# Patient Record
Sex: Male | Born: 1940 | Race: White | Hispanic: No | Marital: Married | Smoking: Never smoker
Health system: Southern US, Community
[De-identification: ages and names within clinical notes are randomized; demographics above are authoritative.]

## PROBLEM LIST (undated history)

## (undated) DIAGNOSIS — C3490 Malignant neoplasm of unspecified part of unspecified bronchus or lung: Secondary | ICD-10-CM

## (undated) DIAGNOSIS — E785 Hyperlipidemia, unspecified: Secondary | ICD-10-CM

## (undated) DIAGNOSIS — I219 Acute myocardial infarction, unspecified: Secondary | ICD-10-CM

## (undated) DIAGNOSIS — Z8719 Personal history of other diseases of the digestive system: Secondary | ICD-10-CM

## (undated) DIAGNOSIS — C61 Malignant neoplasm of prostate: Secondary | ICD-10-CM

## (undated) DIAGNOSIS — C44509 Unspecified malignant neoplasm of skin of other part of trunk: Secondary | ICD-10-CM

## (undated) HISTORY — PX: CARDIAC CATHETERIZATION: SHX172

## (undated) HISTORY — PX: PROSTATECTOMY: SHX69

## (undated) HISTORY — PX: EYE SURGERY: SHX253

---

## 2008-01-07 ENCOUNTER — Ambulatory Visit: Payer: Self-pay | Admitting: Cardiology

## 2013-06-19 ENCOUNTER — Ambulatory Visit: Payer: Self-pay | Admitting: Gastroenterology

## 2013-12-16 DIAGNOSIS — C4360 Malignant melanoma of unspecified upper limb, including shoulder: Secondary | ICD-10-CM | POA: Insufficient documentation

## 2014-04-27 DIAGNOSIS — I2510 Atherosclerotic heart disease of native coronary artery without angina pectoris: Secondary | ICD-10-CM | POA: Insufficient documentation

## 2014-06-25 DIAGNOSIS — Z9889 Other specified postprocedural states: Secondary | ICD-10-CM | POA: Insufficient documentation

## 2016-06-28 DIAGNOSIS — E782 Mixed hyperlipidemia: Secondary | ICD-10-CM | POA: Insufficient documentation

## 2017-03-21 ENCOUNTER — Other Ambulatory Visit: Payer: Self-pay | Admitting: Internal Medicine

## 2017-03-21 DIAGNOSIS — R911 Solitary pulmonary nodule: Secondary | ICD-10-CM

## 2017-05-06 ENCOUNTER — Ambulatory Visit
Admission: RE | Admit: 2017-05-06 | Discharge: 2017-05-06 | Disposition: A | Discharge status: Home / Self Care | Payer: Medicare Other | Source: Ambulatory Visit | Attending: Internal Medicine | Admitting: Internal Medicine

## 2017-05-06 DIAGNOSIS — I2510 Atherosclerotic heart disease of native coronary artery without angina pectoris: Secondary | ICD-10-CM | POA: Insufficient documentation

## 2017-05-06 DIAGNOSIS — J8410 Pulmonary fibrosis, unspecified: Secondary | ICD-10-CM | POA: Insufficient documentation

## 2017-05-06 DIAGNOSIS — R911 Solitary pulmonary nodule: Secondary | ICD-10-CM

## 2017-05-06 DIAGNOSIS — M899 Disorder of bone, unspecified: Secondary | ICD-10-CM | POA: Diagnosis not present

## 2017-05-06 DIAGNOSIS — I700 Atherosclerosis of aorta: Secondary | ICD-10-CM | POA: Insufficient documentation

## 2017-05-10 ENCOUNTER — Other Ambulatory Visit: Payer: Self-pay | Admitting: Internal Medicine

## 2017-05-10 DIAGNOSIS — R9389 Abnormal findings on diagnostic imaging of other specified body structures: Secondary | ICD-10-CM

## 2017-05-10 DIAGNOSIS — R918 Other nonspecific abnormal finding of lung field: Secondary | ICD-10-CM

## 2017-05-10 DIAGNOSIS — C61 Malignant neoplasm of prostate: Secondary | ICD-10-CM

## 2017-05-15 ENCOUNTER — Encounter
Admission: RE | Admit: 2017-05-15 | Discharge: 2017-05-15 | Disposition: A | Discharge status: Home / Self Care | Payer: Medicare Other | Source: Ambulatory Visit | Attending: Internal Medicine | Admitting: Internal Medicine

## 2017-05-15 DIAGNOSIS — R918 Other nonspecific abnormal finding of lung field: Secondary | ICD-10-CM | POA: Insufficient documentation

## 2017-05-15 DIAGNOSIS — R938 Abnormal findings on diagnostic imaging of other specified body structures: Secondary | ICD-10-CM | POA: Insufficient documentation

## 2017-05-15 DIAGNOSIS — R9389 Abnormal findings on diagnostic imaging of other specified body structures: Secondary | ICD-10-CM

## 2017-05-15 DIAGNOSIS — C61 Malignant neoplasm of prostate: Secondary | ICD-10-CM | POA: Diagnosis present

## 2017-05-15 LAB — GLUCOSE, CAPILLARY: GLUCOSE-CAPILLARY: 93 mg/dL (ref 65–99)

## 2017-05-15 MED ORDER — FLUDEOXYGLUCOSE F - 18 (FDG) INJECTION
12.8900 | Freq: Once | INTRAVENOUS | Status: AC | PRN
  Administered 2017-05-15: 12.89 via INTRAVENOUS

## 2017-05-20 ENCOUNTER — Encounter: Payer: Self-pay | Admitting: *Deleted

## 2017-05-20 ENCOUNTER — Other Ambulatory Visit: Payer: Self-pay | Admitting: *Deleted

## 2017-05-20 DIAGNOSIS — R918 Other nonspecific abnormal finding of lung field: Secondary | ICD-10-CM

## 2017-05-20 NOTE — Progress Notes (Signed)
  Oncology Nurse Navigator Documentation  Navigator Location: [facility] (05/20/17 1000) Referral date to RadOnc/MedOnc: 05/17/17 (05/20/17 1000) )Navigator Encounter Type: Introductory phone call (05/20/17 1000)   Abnormal Finding Date: 05/06/17 (05/20/17 1000)                     Barriers/Navigation Needs: Coordination of Care (05/20/17 1000)   Interventions: Coordination of Care (05/20/17 1000)   Coordination of Care: Appts (05/20/17 1000)        Acuity: Level 2 (05/20/17 1000)   Acuity Level 2: Initial guidance, education and coordination as needed;Assistance expediting appointments (05/20/17 1000)    Referral received from Dr. Emily Filbert for lung mass, bone mets. Discussed with Dr. Rogue Bussing. Pt scheduled for Lung [facility]DC on Friday 7/20 at 9:30am. Appt scheduled for Pulmonary on Friday 7/20 at 11am. Pt to see Dr. Baruch Gouty on Tuesday 7/17 at 1pm, then CT sim following at 2pm. All appts explained to patient and verbally given to patient over the phone. Pt verbalized understanding and confirmed appts. States will be out of town next week. Pt added for case conference on Thursday 7/19 to review images and discuss route of biopsy.    Time Spent with Patient: 60 (05/20/17 1000)

## 2017-05-21 ENCOUNTER — Encounter: Payer: Self-pay | Admitting: Radiation Oncology

## 2017-05-21 ENCOUNTER — Ambulatory Visit
Admission: RE | Admit: 2017-05-21 | Discharge: 2017-05-21 | Disposition: A | Discharge status: Home / Self Care | Payer: Medicare Other | Source: Ambulatory Visit | Attending: Radiation Oncology | Admitting: Radiation Oncology

## 2017-05-21 ENCOUNTER — Encounter: Payer: Self-pay | Admitting: *Deleted

## 2017-05-21 ENCOUNTER — Ambulatory Visit: Admission: RE | Admit: 2017-05-21 | Payer: Medicare Other | Source: Ambulatory Visit

## 2017-05-21 VITALS — BP 128/70 | HR 52 | Temp 98.2°F | Resp 16 | Ht 69.5 in | Wt 190.7 lb

## 2017-05-21 DIAGNOSIS — C799 Secondary malignant neoplasm of unspecified site: Secondary | ICD-10-CM

## 2017-05-21 DIAGNOSIS — M79652 Pain in left thigh: Secondary | ICD-10-CM | POA: Diagnosis not present

## 2017-05-21 DIAGNOSIS — E785 Hyperlipidemia, unspecified: Secondary | ICD-10-CM | POA: Insufficient documentation

## 2017-05-21 DIAGNOSIS — Z9079 Acquired absence of other genital organ(s): Secondary | ICD-10-CM | POA: Insufficient documentation

## 2017-05-21 DIAGNOSIS — M899 Disorder of bone, unspecified: Secondary | ICD-10-CM | POA: Diagnosis not present

## 2017-05-21 DIAGNOSIS — Z8546 Personal history of malignant neoplasm of prostate: Secondary | ICD-10-CM | POA: Diagnosis not present

## 2017-05-21 DIAGNOSIS — Z79899 Other long term (current) drug therapy: Secondary | ICD-10-CM | POA: Diagnosis not present

## 2017-05-21 DIAGNOSIS — R05 Cough: Secondary | ICD-10-CM | POA: Insufficient documentation

## 2017-05-21 DIAGNOSIS — R918 Other nonspecific abnormal finding of lung field: Secondary | ICD-10-CM | POA: Diagnosis present

## 2017-05-21 DIAGNOSIS — Z7982 Long term (current) use of aspirin: Secondary | ICD-10-CM | POA: Insufficient documentation

## 2017-05-21 DIAGNOSIS — Z809 Family history of malignant neoplasm, unspecified: Secondary | ICD-10-CM | POA: Diagnosis not present

## 2017-05-21 DIAGNOSIS — R490 Dysphonia: Secondary | ICD-10-CM | POA: Insufficient documentation

## 2017-05-21 DIAGNOSIS — I252 Old myocardial infarction: Secondary | ICD-10-CM | POA: Diagnosis not present

## 2017-05-21 HISTORY — DX: Hyperlipidemia, unspecified: E78.5

## 2017-05-21 HISTORY — DX: Malignant neoplasm of unspecified part of unspecified bronchus or lung: C34.90

## 2017-05-21 HISTORY — DX: Malignant neoplasm of prostate: C61

## 2017-05-21 HISTORY — DX: Acute myocardial infarction, unspecified: I21.9

## 2017-05-21 NOTE — Progress Notes (Signed)
Patient here for initial evaluation. Having moderate pain in left lower leg.

## 2017-05-21 NOTE — Progress Notes (Signed)
  Oncology Nurse Navigator Documentation  Navigator Location: [facility] (05/21/17 1500)   )Navigator Encounter Type: Initial RadOnc (05/21/17 1500)                         Barriers/Navigation Needs: Coordination of Care (05/21/17 1500)   Interventions: Coordination of Care (05/21/17 1500)   Coordination of Care: Radiology (05/21/17 1500)         met with patient during initial rad-onc consultation for bone mets. All questions answered at time of visit. All future appts reviewed with patient. Pt instructed to call with any further questions. Pt verbalized understanding.          Time Spent with Patient: 60 (05/21/17 1500)

## 2017-05-21 NOTE — Consult Note (Signed)
NEW PATIENT EVALUATION  Name: George Greene.  MRN: [id number]  Date:   05/21/2017     DOB: Sep 23, 1941   This 76 y.o. 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  REFERRING PHYSICIAN: Rusty Aus, MD  CHIEF COMPLAINT:  Chief Complaint  Patient presents with  . lung mets    DIAGNOSIS: The encounter diagnosis was Metastatic disease ([place]).   PREVIOUS INVESTIGATIONS:  PET CT and CT scan reviewed Clinical notes reviewed  HPI: Patient is a 76 year old male with history of prostate cancer status post prostatectomy proximal me 10 years prior with salvage radiation therapy delivered at [facility]. His PSA has been around 6. He recently presented with some cough chest x-ray followed by CT scan showed a 3 cm cavitary lesions appear segment of left lower lobe concerning for potential primary lung cancer. Also multiple sclerotic lesions throughout the axial and appendicular skeleton concerning for metastatic disease. PET scan followed again confirming hypermetabolic mass in his left lung as well as hilar mediastinal adenopathy. Also widespread bony metastasis consistent with metastatic disease. Patient has not had a tissue diagnosis established as of yet. He does complain of some pain in his mid left thigh does have 2 separate lesions noted on PET CT scan in the left femur. I was asked to evaluate the patient for possible palliative radiation therapy. 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  PLANNED TREATMENT REGIMEN: Palliative radiation therapy to his left femur  PAST MEDICAL HISTORY:  has a past medical history of Hyperlipidemia; Lung cancer ([place]); Myocardial infarction [facility]); and Prostate cancer ([place]).    PAST SURGICAL HISTORY: History reviewed. No pertinent surgical history.  FAMILY HISTORY: family history includes Cancer in his maternal uncle.  SOCIAL  HISTORY:  reports that he has never smoked. He has never used smokeless tobacco. He reports that he drinks alcohol. He reports that he does not use drugs.  ALLERGIES: Patient has no allergy information on record.  MEDICATIONS:  Current Outpatient Prescriptions  Medication Sig Dispense Refill  . aspirin EC 81 MG tablet Take by mouth.    Marland Kitchen lisinopril (PRINIVIL,ZESTRIL) 5 MG tablet TAKE ONE (1) TABLET EACH DAY    . metoprolol tartrate (LOPRESSOR) 25 MG tablet TAKE ONE TABLET TWICE DAILY    . Multiple Vitamins-Minerals (EQL VISION FORMULA) TABS Take by mouth.    . rosuvastatin (CRESTOR) 10 MG tablet TAKE ONE (1) TABLET EACH DAY    . traMADol (ULTRAM) 50 MG tablet      No current facility-administered medications for this encounter.     ECOG PERFORMANCE STATUS:  1 - Symptomatic but completely ambulatory  REVIEW OF SYSTEMS:  Patient denies any weight loss, fatigue, weakness, fever, chills or night sweats. Patient denies any loss of vision, blurred vision. Patient denies any ringing  of the ears or hearing loss. No irregular heartbeat. Patient denies heart murmur or history of fainting. Patient denies any chest pain or pain radiating to her upper extremities. Patient denies any shortness of breath, difficulty breathing at night, cough or hemoptysis. Patient denies any swelling in the lower legs. Patient denies any nausea vomiting, vomiting of blood, or coffee ground material in the vomitus. Patient denies any stomach pain. Patient states has had normal bowel movements no significant constipation or diarrhea. Patient denies any dysuria, hematuria or significant nocturia. Patient denies any problems walking, swelling in the joints or loss of  balance. Patient denies any skin changes, loss of hair or loss of weight. Patient denies any excessive worrying or anxiety or significant depression. Patient denies any problems with insomnia. Patient denies excessive thirst, polyuria, polydipsia. Patient denies any  swollen glands, patient denies easy bruising or easy bleeding. Patient denies any recent infections, allergies or URI. Patient "s visual fields have not changed significantly in recent time.    PHYSICAL EXAM: BP 128/70 (BP Location: Left Arm, Patient Position: Sitting, Cuff Size: Large)   Pulse (!) 52   Temp 98.2 F (36.8 C) (Tympanic)   Resp 16   Ht 5' 9.5" (1.765 m)   Wt 190 lb 11.2 oz (86.5 kg)   BMI 27.76 kg/m  Well-developed well-nourished patient in NAD. HEENT reveals PERLA, EOMI, discs not visualized.  Oral cavity is clear. No oral mucosal lesions are identified. Neck is clear without evidence of cervical or supraclavicular adenopathy. Lungs are clear to A&P. Cardiac examination is essentially unremarkable with regular rate and rhythm without murmur rub or thrill. Abdomen is benign with no organomegaly or masses noted. Motor sensory and DTR levels are equal and symmetric in the upper and lower extremities. Cranial nerves II through XII are grossly intact. Proprioception is intact. No peripheral adenopathy or edema is identified. No motor or sensory levels are noted. Crude visual fields are within normal range.  LABORATORY DATA: No current pathology    RADIOLOGY RESULTS: PET CT and CT scans reviewed and compatible with the above-stated findings   IMPRESSION: Probable stage IV lung cancer  PLAN: At this time patient needs workup and occluding tissue diagnosis. He is seeing pulmonology the end of this week and probably will be scheduled for bronchoscopy with biopsy of his subcarinal hypermetabolic node. He is also seeing medical oncology the same day. I have ordered MRI scan of his brain for complete complete staging workup. I believe palliative radiation therapy to his left femur may be in order I would probably treat hypofractionated course 800 cGy 1. I have set up a CT simulation in about 2 weeks after the patient returns from a mission he is doing in [place] with his wife.  Wife is a former patient of mine. I've tentatively set him up for simulation at that time. We'll coordinate care with other caregivers.  I would like to take this opportunity to thank you for allowing me to participate in the care of your patient.Armstead Peaks., MD

## 2017-05-24 ENCOUNTER — Encounter: Payer: Self-pay | Admitting: *Deleted

## 2017-05-24 ENCOUNTER — Inpatient Hospital Stay: Payer: Medicare Other | Attending: Internal Medicine | Admitting: Internal Medicine

## 2017-05-24 ENCOUNTER — Other Ambulatory Visit
Admission: RE | Admit: 2017-05-24 | Discharge: 2017-05-24 | Disposition: A | Discharge status: Home / Self Care | Payer: Medicare Other | Source: Ambulatory Visit | Attending: Pulmonary Disease | Admitting: Pulmonary Disease

## 2017-05-24 ENCOUNTER — Ambulatory Visit (INDEPENDENT_AMBULATORY_CARE_PROVIDER_SITE_OTHER): Payer: Medicare Other | Admitting: Pulmonary Disease

## 2017-05-24 ENCOUNTER — Ambulatory Visit
Admission: RE | Admit: 2017-05-24 | Discharge: 2017-05-24 | Disposition: A | Discharge status: Home / Self Care | Payer: Medicare Other | Source: Ambulatory Visit | Attending: Radiation Oncology | Admitting: Radiation Oncology

## 2017-05-24 ENCOUNTER — Encounter: Payer: Self-pay | Admitting: Pulmonary Disease

## 2017-05-24 ENCOUNTER — Encounter: Payer: Self-pay | Admitting: Internal Medicine

## 2017-05-24 ENCOUNTER — Inpatient Hospital Stay: Payer: Medicare Other

## 2017-05-24 VITALS — BP 134/72 | HR 52 | Ht 69.5 in | Wt 190.0 lb

## 2017-05-24 DIAGNOSIS — E041 Nontoxic single thyroid nodule: Secondary | ICD-10-CM | POA: Diagnosis not present

## 2017-05-24 DIAGNOSIS — C799 Secondary malignant neoplasm of unspecified site: Secondary | ICD-10-CM

## 2017-05-24 DIAGNOSIS — C801 Malignant (primary) neoplasm, unspecified: Secondary | ICD-10-CM | POA: Diagnosis not present

## 2017-05-24 DIAGNOSIS — J984 Other disorders of lung: Secondary | ICD-10-CM

## 2017-05-24 DIAGNOSIS — I2510 Atherosclerotic heart disease of native coronary artery without angina pectoris: Secondary | ICD-10-CM | POA: Diagnosis not present

## 2017-05-24 DIAGNOSIS — R590 Localized enlarged lymph nodes: Secondary | ICD-10-CM

## 2017-05-24 DIAGNOSIS — Z9079 Acquired absence of other genital organ(s): Secondary | ICD-10-CM | POA: Diagnosis not present

## 2017-05-24 DIAGNOSIS — R918 Other nonspecific abnormal finding of lung field: Secondary | ICD-10-CM | POA: Diagnosis not present

## 2017-05-24 DIAGNOSIS — J8410 Pulmonary fibrosis, unspecified: Secondary | ICD-10-CM | POA: Diagnosis not present

## 2017-05-24 DIAGNOSIS — Z79899 Other long term (current) drug therapy: Secondary | ICD-10-CM | POA: Diagnosis not present

## 2017-05-24 DIAGNOSIS — E785 Hyperlipidemia, unspecified: Secondary | ICD-10-CM | POA: Insufficient documentation

## 2017-05-24 DIAGNOSIS — R911 Solitary pulmonary nodule: Secondary | ICD-10-CM | POA: Diagnosis not present

## 2017-05-24 DIAGNOSIS — C7951 Secondary malignant neoplasm of bone: Secondary | ICD-10-CM

## 2017-05-24 DIAGNOSIS — C61 Malignant neoplasm of prostate: Secondary | ICD-10-CM

## 2017-05-24 DIAGNOSIS — Z7982 Long term (current) use of aspirin: Secondary | ICD-10-CM | POA: Insufficient documentation

## 2017-05-24 DIAGNOSIS — Z8546 Personal history of malignant neoplasm of prostate: Secondary | ICD-10-CM | POA: Diagnosis not present

## 2017-05-24 DIAGNOSIS — I700 Atherosclerosis of aorta: Secondary | ICD-10-CM | POA: Insufficient documentation

## 2017-05-24 DIAGNOSIS — K5730 Diverticulosis of large intestine without perforation or abscess without bleeding: Secondary | ICD-10-CM | POA: Diagnosis not present

## 2017-05-24 DIAGNOSIS — I252 Old myocardial infarction: Secondary | ICD-10-CM | POA: Diagnosis not present

## 2017-05-24 LAB — CBC WITH DIFFERENTIAL/PLATELET
Basophils Absolute: 0 10*3/uL (ref 0–0.1)
Basophils Relative: 0 %
Eosinophils Absolute: 0.1 10*3/uL (ref 0–0.7)
Eosinophils Relative: 2 %
HCT: 37.9 % — ABNORMAL LOW (ref 40.0–52.0)
Hemoglobin: 13.1 g/dL (ref 13.0–18.0)
Lymphocytes Relative: 17 %
Lymphs Abs: 0.8 10*3/uL — ABNORMAL LOW (ref 1.0–3.6)
MCH: 31.2 pg (ref 26.0–34.0)
MCHC: 34.4 g/dL (ref 32.0–36.0)
MCV: 90.5 fL (ref 80.0–100.0)
Monocytes Absolute: 0.6 10*3/uL (ref 0.2–1.0)
Monocytes Relative: 12 %
Neutro Abs: 3.2 10*3/uL (ref 1.4–6.5)
Neutrophils Relative %: 69 %
Platelets: 212 10*3/uL (ref 150–440)
RBC: 4.19 MIL/uL — ABNORMAL LOW (ref 4.40–5.90)
RDW: 14.4 % (ref 11.5–14.5)
WBC: 4.6 10*3/uL (ref 3.8–10.6)

## 2017-05-24 LAB — COMPREHENSIVE METABOLIC PANEL
ALBUMIN: 3.9 g/dL (ref 3.5–5.0)
ALT: 10 U/L — ABNORMAL LOW (ref 17–63)
AST: 16 U/L (ref 15–41)
Alkaline Phosphatase: 131 U/L — ABNORMAL HIGH (ref 38–126)
Anion gap: 5 (ref 5–15)
BILIRUBIN TOTAL: 0.6 mg/dL (ref 0.3–1.2)
BUN: 17 mg/dL (ref 6–20)
CO2: 32 mmol/L (ref 22–32)
Calcium: 9.1 mg/dL (ref 8.9–10.3)
Chloride: 102 mmol/L (ref 101–111)
Creatinine, Ser: 0.94 mg/dL (ref 0.61–1.24)
GFR calc Af Amer: >60 mL/min (ref >60)
GFR calc non Af Amer: >60 mL/min (ref >60)
GLUCOSE: 111 mg/dL — AB (ref 65–99)
POTASSIUM: 4.4 mmol/L (ref 3.5–5.1)
Sodium: 139 mmol/L (ref 135–145)
TOTAL PROTEIN: 6.8 g/dL (ref 6.5–8.1)

## 2017-05-24 LAB — APTT: aPTT: 35 s (ref 24–36)

## 2017-05-24 LAB — PSA
Prostatic Specific Antigen: 8.59 ng/mL — ABNORMAL HIGH (ref 0.00–4.00)
Prostatic Specific Antigen: 9.04 ng/mL — ABNORMAL HIGH (ref 0.00–4.00)

## 2017-05-24 LAB — PROTIME-INR
INR: 1.06
Prothrombin Time: 13.8 s (ref 11.4–15.2)

## 2017-05-24 MED ORDER — GADOBENATE DIMEGLUMINE 529 MG/ML IV SOLN
20.0000 mL | Freq: Once | INTRAVENOUS | Status: AC | PRN
  Administered 2017-05-24: 18 mL via INTRAVENOUS

## 2017-05-24 NOTE — Patient Instructions (Signed)
We have ordered a CT-guided biopsy of the cavitary lesion in the left lower lobe  I have ordered a blood test to look for possible TB   Follow-up will be arranged as needed

## 2017-05-24 NOTE — Progress Notes (Signed)
  Oncology Nurse Navigator Documentation  Navigator Location: [facility] (05/24/17 1100)   )Navigator Encounter Type: Initial MedOnc (05/24/17 1100)                 Multidisiplinary Clinic Type: Thoracic (05/24/17 1100)     Treatment Phase: Abnormal Scans (05/24/17 1100) Barriers/Navigation Needs: Coordination of Care (05/24/17 1100)   Interventions: Coordination of Care (05/24/17 1100)   Coordination of Care: Appts;Radiology (05/24/17 1100)         met with patient during initial consultation with Dr. Rogue Bussing during thoracic multidisciplinary clinic. All questions answered at time of visit. Pt offered education regarding different types of lung biopsies. Pt will be out of town next week and will return on 7/29. Coordinating appt for biopsy to be tentatively scheduled on Monday 7/30 and to follow up with Dr. B at the end of that week for treatment planning.  Pt escorted to [place] for consultation with Dr. Alva Garnet to discuss possible bronchoscopy. All upcoming appts reviewed with the patient. Pt stated that if needed to be contacted while out of the country then we may call his friend, Etta Quill, at [phone]. Pt verbalized understanding of all instructions and has my contact info if needs to contact me with any further questions.          Time Spent with Patient: 60 (05/24/17 1100)

## 2017-05-24 NOTE — Assessment & Plan Note (Addendum)
#  Approximate 2.5 cm cavitating mass in the left lung; mediastinal/hilar adenopathy. Highly suspicious of malignancy- especially with the multiple bone metastasis is noted. Differential includes- primary lung cancer versus metastatic prostate cancer to the lung/bone.   # Long discussion the patient regarding- the importance of biopsy confirming the diagnosis; also help identify the primary- as a treatment and prognosis would differ. Biopsy modality- transthoracic CT-guided versus navigational bronchoscopy with pulmonary. Patient awaiting pulmonary evaluation today. Patient imaging/reviewed at the tumor conference.  # left hip pain control/Multiple bone metastases- ~candidate for X-geva. Met with radiation oncology; will be candidate for palliative radiation for pain control. Patient will continue NSAIDs/tramadol for pain.   # Check blood work-CBC CMP and PSA; coagulation profile.   # The above plan of care was discussed with patient and wife in detail. Imaging was also reviewed. All questions were answered. The patient planning to go out of country- for1 week [place] trip. Workup planned after the return.  # I reviewed the blood work- with the patient in detail; also reviewed the imaging independently [as summarized above]; and with the patient in detail.   Thank you Dr.Miller for allowing me to participate in the care of your pleasant patient. Please do not hesitate to contact me with questions or concerns in the interim.

## 2017-05-24 NOTE — Progress Notes (Signed)
Patient is here today as a new patient. Patient reports left sided hip & leg pain.

## 2017-05-24 NOTE — Progress Notes (Signed)
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 76 y.o.  male with a history of coronary artery disease; and a history of prostate cancer [2000]- has been referred to [place] for further evaluation of his left lung mass/also multiple bone lesions noted.  Patient states that he noted to have "cold/chest congestion" sometime in April/May 2018. The chest x-ray was slightly abnormal as per the PCP. This was followed up by a CT scan after his return from [place] in July- which showed left lower lobe cavitary lesion; mediastinal/hilar adenopathy. This was followed by PET scan- that also showed multiple bone lesions.  Patient noted to have pain in the left hip/left upper thigh over the last few months. He has been taking Tylenol and tramadol for the pain.   Patient's appetite is otherwise good. No weight loss. No nausea no vomiting or headaches. No chest pain. Cough and shortness of breath has resolved. No fevers or chills. Patient never smoked.  ROS: A complete 10 point review of system is done which is negative except mentioned above in history of present illness  MEDICAL HISTORY:  Past Medical History:  Diagnosis Date  . Hyperlipidemia   . Lung cancer ([place])   . Myocardial infarction ([place])   . Prostate cancer ([place])      SURGICAL HISTORY:Prostatectomy [2001]; cardiac catheter [2010] No past surgical history on file.  SOCIAL HISTORY: worked for [employer] 38 years; [place] with family. He never smoked. Occasional alcohol. Social History   Social History  . Marital status: Married    Spouse name: N/A  . Number of children: N/A  . Years of education: N/A   Occupational History  . Not on file.   Social History Main Topics  . Smoking status: Never Smoker  . Smokeless tobacco: Never Used  . Alcohol use 0.6 oz/week    1 Glasses of wine per week     Comment: "occasional"  . Drug use: No  . Sexual activity: Not on file   Other Topics Concern  . Not on file   Social History Narrative  . No narrative on file    FAMILY HISTORY: pat grand mother- ? Cancer;  Family History  Problem Relation Age of Onset  . Cancer Maternal Uncle        colon    ALLERGIES:  has No Known Allergies.  MEDICATIONS:  Current Outpatient Prescriptions  Medication Sig Dispense Refill  . aspirin EC 81 MG tablet Take by mouth.    Marland Kitchen lisinopril (PRINIVIL,ZESTRIL) 5 MG tablet TAKE ONE (1) TABLET EACH DAY    . metoprolol tartrate (LOPRESSOR) 25 MG tablet TAKE ONE TABLET TWICE DAILY    . Multiple Vitamins-Minerals (EQL VISION FORMULA) TABS Take by mouth.    . rosuvastatin (CRESTOR) 10 MG tablet TAKE ONE (1) TABLET EACH DAY    .  traMADol (ULTRAM) 50 MG tablet      No current facility-administered medications for this visit.       Marland Kitchen  PHYSICAL EXAMINATION: ECOG PERFORMANCE STATUS: 1 - Symptomatic but completely ambulatory  Vitals:   05/24/17 0949  BP: 126/68  Pulse: (!) 52  Resp: 16  Temp: (!) 97.1 F (36.2 C)   Filed Weights   05/24/17 0949  Weight: 191 lb (86.6 kg)    GENERAL: Well-nourished well-developed; Alert, no distress and comfortable.   Accompanied by his wife. EYES: no pallor or icterus OROPHARYNX: no thrush or ulceration; good dentition  NECK: supple, no masses felt LYMPH:  no palpable  lymphadenopathy in the cervical, axillary or inguinal regions LUNGS: clear to auscultation and  No wheeze or crackles HEART/CVS: regular rate & rhythm and no murmurs; No lower extremity edema ABDOMEN: abdomen soft, non-tender and normal bowel sounds Musculoskeletal:no cyanosis of digits and no clubbing  PSYCH: alert & oriented x 3 with fluent speech NEURO: no focal motor/sensory deficits SKIN:  no rashes or significant lesions  LABORATORY DATA:  I have reviewed the data as listed Lab Results  Component Value Date   WBC 4.6 05/24/2017   HGB 13.1 05/24/2017   HCT 37.9 (L) 05/24/2017   MCV 90.5 05/24/2017   PLT 212 05/24/2017    Recent Labs  05/24/17 1106  NA 139  K 4.4  CL 102  CO2 32  GLUCOSE 111*  BUN 17  CREATININE 0.94  CALCIUM 9.1  GFRNONAA >60  GFRAA >60  PROT 6.8  ALBUMIN 3.9  AST 16  ALT 10*  ALKPHOS 131*  BILITOT 0.6    RADIOGRAPHIC STUDIES: I have personally reviewed the radiological images as listed and agreed with the findings in the report. Ct Chest Wo Contrast  Result Date: 05/06/2017 CLINICAL DATA:  76 year old male with history of pulmonary nodule noted on prior chest x-ray. Followup study. EXAM: CT CHEST WITHOUT CONTRAST TECHNIQUE: Multidetector CT imaging of the chest was performed following the standard protocol without IV contrast. COMPARISON:  No prior chest CT.  Chest x-ray 03/20/2017. FINDINGS: Cardiovascular: Heart size is normal. There is no significant pericardial fluid, thickening or pericardial calcification. There is aortic atherosclerosis, as well as atherosclerosis of the great vessels of the mediastinum and the coronary arteries, including calcified atherosclerotic plaque in the left main, left anterior descending, left circumflex and right coronary arteries. Mediastinum/Nodes: There is soft tissue fullness in the left hilar region which is suspicious for potential lymphadenopathy, although this is poorly evaluated without IV contrast (best  appreciated on axial image 62 of series 2). In addition, there is an enlarged subcarinal lymph node measuring 1.4 cm in short axis. Esophagus is unremarkable in appearance. No axillary lymphadenopathy. Lungs/Pleura: The nodule of concern in the periphery of the right lung base is in the anterior aspect of the right lower lobe (axial image 96 of series 3) measuring 1.5 cm in total diameter with a central densely calcified nidus that measures 7 mm, compatible with a granuloma. The other area of concern on the recent chest radiograph in the superior segment of the left lower lobe corresponds to a thick-walled cavitary lesion which measures 2.3 x 2.9 x 2.1 cm (axial image 71 of series 3 and coronal image 105 of series 5). This lesion has thick irregular walls with some macrolobulation and spiculated margins, concerning for potential neoplasm. Irregular-shaped 7 x 4 mm nodule in the left upper lobe (axial image 58 of series 3). Scattered areas of linear scarring  are noted in the lower lobes of the lungs bilaterally. No pleural effusions. Upper Abdomen: Incompletely visualized lesion in the inferior aspect of the right lobe of the liver measuring 2.6 x 2.6 cm (axial image 169 of series 2) is indeterminate on today's examination, but statistically likely a cyst. 1.3 cm nodule in the right adrenal gland is indeterminate (40 HU). Aortic atherosclerosis. Musculoskeletal: Well-circumscribed 1.6 x 2.3 cm low-attenuation lesion in the subcutaneous fat of the medial aspect of the left anterior chest wall (axial image 53 of series 2), likely to represent a sebaceous cyst. Multiple tiny sclerotic lesions are noted throughout the visualized axial and appendicular skeleton, concerning for potential metastatic disease. IMPRESSION: 1. 2.3 x 2.9 x 2.1 cm thick-walled cavitary lesion in the superior segment of the left lower lobe concerning for potential primary bronchogenic neoplasm such as a squamous cell carcinoma. This is associated  with subcarinal lymphadenopathy, and potentially left hilar lymphadenopathy (although this is poorly evaluated on today's noncontrast CT examination). Further evaluation with PET-CT is recommended at this time. 2. Multiple sclerotic lesions noted throughout the visualized axial and appendicular skeleton, concerning for metastatic disease to the bone. 3. Other previously noted lesion in the right lung base is a calcified granuloma in the anterior aspect of the right lower lobe. 4. Aortic atherosclerosis, in addition to left main and 3 vessel coronary artery disease. Assessment for potential risk factor modification, dietary therapy or pharmacologic therapy may be warranted, if clinically indicated. 5. Additional incidental findings, as above. These results will be called to the ordering clinician or representative by the Radiologist Assistant, and communication documented in the PACS or zVision Dashboard. Aortic Atherosclerosis (ICD10-I70.0). Electronically Signed   By: Vinnie Langton M.D.   On: 05/06/2017 10:59   Mr Jeri Cos ZG Contrast  Result Date: 05/24/2017 CLINICAL DATA:  Left lower lobe lung mass with bone metastases. Evaluation for brain metastases. EXAM: MRI HEAD WITHOUT AND WITH CONTRAST TECHNIQUE: Multiplanar, multiecho pulse sequences of the brain and surrounding structures were obtained without and with intravenous contrast. CONTRAST:  69m MULTIHANCE GADOBENATE DIMEGLUMINE 529 MG/ML IV SOLN COMPARISON:  None. FINDINGS: Brain: There is no evidence of acute infarct, intracranial hemorrhage, mass, midline shift, or extra-axial fluid collection. Generalized cerebral atrophy is mild for age. A few scattered cerebral white matter T2 hyperintensities are within normal limits for age. No abnormal enhancement is identified. Vascular: Major intracranial vascular flow voids are preserved. Skull and upper cervical spine: Unremarkable bone marrow signal. Sinuses/Orbits: Bilateral cataract extraction. Trace  right mastoid fluid. Clear paranasal sinuses. Other: None. IMPRESSION: No evidence of intracranial metastases. Electronically Signed   By: ALogan BoresM.D.   On: 05/24/2017 17:18   Nm Pet Image Initial (pi) Skull Base To Thigh  Result Date: 05/15/2017 CLINICAL DATA:  Initial treatment strategy for lung mass. History of prostate cancer. EXAM: NUCLEAR MEDICINE PET SKULL BASE TO THIGH TECHNIQUE: 12.9 mCi F-18 FDG was injected intravenously. Full-ring PET imaging was performed from the skull base to thigh after the radiotracer. CT data was obtained and used for attenuation correction and anatomic localization. FASTING BLOOD GLUCOSE:  Value: 93 mg/dl COMPARISON:  Chest CT 05/06/2017 FINDINGS: NECK No hypermetabolic lymph nodes in the neck. CHEST The dominant superior segment left lower lobe cavitary nodule measures 2.7 by 2.0 cm on image 92/3 and has a maximum standard uptake value of 8.8. There is hypermetabolic left hilar, left infrahilar, and subcarinal adenopathy along with a faintly hypermetabolic right paratracheal lymph node. Index subcarinal lymph node measures  1.2 cm in short axis on image 94/3 and has a maximum standard uptake value 11.5. A densely calcified right lower lobe nodule measuring 1.4 cm in diameter is not hypermetabolic, and may reflect old granulomatous disease. Mild airway thickening is present. There is a 0.6 by 0.4 cm left upper lobe nodule on image 85/3 which is not perceptibly hypermetabolic but which is below sensitive PET-CT size thresholds. Coronary, aortic arch, and branch vessel atherosclerotic vascular disease. Partially calcified left thyroid nodule measures up to 1.7 cm in long axis but is not hypermetabolic. Note is made of a cutaneous and subcutaneous cystic lesion along the chest just to the left of midline on image 90/3, without associated hypermetabolic activity, likely a sebaceous cyst or similar benign lesion. ABDOMEN/PELVIS No abnormal hypermetabolic activity within the  liver, pancreas, adrenal glands, or spleen. No hypermetabolic lymph nodes in the abdomen or pelvis. Photopenic cyst inferiorly in the right hepatic lobe. Sigmoid colon diverticulosis.  Prostatectomy. Aortoiliac atherosclerotic vascular disease. Descending colon diverticulosis. SKELETON Hypermetabolic osseous metastatic disease is noted, with dominant lesions involving the left medial clavicle, right sternum, right T8 transverse process, ribs, and left proximal and mid femoral shaft. The index left medial clavicular lesion has a maximum SUV of 11.7. For the most part these metastatic lesions are mildly sclerotic. IMPRESSION: 1. The left lower lobe cavitary nodule is hypermetabolic with a maximum SUV of 8.8, and is competent by left hilar, infrahilar, and subcarinal hypermetabolic adenopathy as well as scattered hypermetabolic osseous metastatic disease. Lung cancer such as squamous cell carcinoma, or metastatic prostate cancer might cause this appearance. 2.  Aortic Atherosclerosis (ICD10-I70.0).  Coronary atherosclerosis. 3. Descending and sigmoid colon diverticulosis. 4. Prostatectomy. 5. Left thyroid nodule was present but is not hypermetabolic on PET-CT. Electronically Signed   By: Van Clines M.D.   On: 05/15/2017 14:30    ASSESSMENT & PLAN:   Cavitating mass of lower lobe of left lung # Approximate 2.5 cm cavitating mass in the left lung; mediastinal/hilar adenopathy. Highly suspicious of malignancy- especially with the multiple bone metastasis is noted. Differential includes- primary lung cancer versus metastatic prostate cancer to the lung/bone.   # Long discussion the patient regarding- the importance of biopsy confirming the diagnosis; also help identify the primary- as a treatment and prognosis would differ. Biopsy modality- transthoracic CT-guided versus navigational bronchoscopy with pulmonary. Patient awaiting pulmonary evaluation today.  # left hip pain control/Multiple bone  metastases- ~candidate for X-geva. Met with radiation oncology; will be candidate for palliative radiation for pain control. Patient will continue NSAIDs/tramadol for pain.   # Check blood work-CBC CMP and PSA; coagulation profile.   # The above plan of care was discussed with patient and wife in detail. Imaging was also reviewed. All questions were answered. The patient planning to go out of country- for1 week [place] trip. Workup planned after the return.  # I reviewed the blood work- with the patient in detail; also reviewed the imaging independently [as summarized above]; and with the patient in detail.   Thank you Dr.Miller for allowing me to participate in the care of your pleasant patient. Please do not hesitate to contact me with questions or concerns in the interim.  All questions were answered. The patient knows to call the clinic with any problems, questions or concerns.    Cammie Sickle, MD 05/24/2017 6:17 PM

## 2017-05-26 NOTE — Progress Notes (Signed)
PULMONARY CONSULT NOTE  Requesting MD/Service: Rogue Bussing Date of initial consultation: 05/24/17 Reason for consultation: Abnormal CT chest  PT PROFILE: 76 y.o. male never smoker with history of prostate cancer, status post resection 2001 and recently elevated PSA. Referred for evaluation of abnormal lesion noted on CT chest.  DATA: 05/06/17 CT chest: Irregular, thick-walled cavitary lesion in the superior segment of the left lower lobe with left hilar adenopathy. Well-circumscribed 15 mm nodule in right lower lobe with central calcification. This has previously been noted and is unchanged.  05/15/17 PET scan: The left lower lobe cavitary nodule is hypermetabolic with a maximum SUV of 8.8, and is competent by left hilar, infrahilar, and subcarinal hypermetabolic adenopathy as well as scattered hypermetabolic osseous metastatic disease. Lung cancer such as squamous cell carcinoma, or metastatic prostate cancer might cause this appearance.  HPI:  As above. He has never smoked. He returned from [place] (travels to [place] regularly) in May of this year with symptoms of a "cold". He was seen by his primary care physician and underwent a chest x-ray which revealed a 1.5 cm circumscribed pulmonary nodule in the RLL and a vague opacity in the left lower lobe. He was treated with antibiotics. He underwent a CT scan of the chest, specifically to evaluate the right lower lobe nodule. The results of that CT scan are documented above. He is referred to me for further evaluation. He has no respiratory or pulmonary symptoms at the present time. He does complain of left lower extremity and left hip pain  Past Medical History:  Diagnosis Date  . Hyperlipidemia   . Lung cancer ([place])   . Myocardial infarction ([place])   . Prostate cancer [facility])     History reviewed. No pertinent surgical history.  MEDICATIONS: I have reviewed all medications and confirmed regimen as documented  Social History   Social  History  . Marital status: Married    Spouse name: N/A  . Number of children: N/A  . Years of education: N/A   Occupational History  . Not on file.   Social History Main Topics  . Smoking status: Never Smoker  . Smokeless tobacco: Never Used  . Alcohol use 0.6 oz/week    1 Glasses of wine per week     Comment: "occasional"  . Drug use: No  . Sexual activity: Not on file   Other Topics Concern  . Not on file   Social History Narrative  . No narrative on file    Family History  Problem Relation Age of Onset  . Cancer Maternal Uncle        colon    ROS: No fever, myalgias/arthralgias, unexplained weight loss or weight gain No new focal weakness or sensory deficits No otalgia, hearing loss, visual changes, nasal and sinus symptoms, mouth and throat problems No neck pain or adenopathy No abdominal pain, N/V/D, diarrhea, change in bowel pattern No dysuria, change in urinary pattern   Vitals:   05/24/17 1122  BP: 134/72  Pulse: (!) 52  SpO2: 97%  Weight: 190 lb (86.2 kg)  Height: 5' 9.5" (1.765 m)     EXAM:  Gen: WDWN, No overt respiratory distress HEENT: NCAT, sclera white, oropharynx normal Neck: Supple without LAN, thyromegaly, JVD Lungs: breath sounds Full, percussion normal, no adventitious sounds Cardiovascular: RRR, no murmurs noted Abdomen: Soft, nontender, normal BS Ext: without clubbing, cyanosis, edema Neuro: CNs grossly intact, motor and sensory intact Skin: Limited exam, no lesions noted  DATA:   BMP Latest  Ref Rng & Units 05/24/2017  Glucose 65 - 99 mg/dL 111(H)  BUN 6 - 20 mg/dL 17  Creatinine 0.61 - 1.24 mg/dL 0.94  Sodium 135 - 145 mmol/L 139  Potassium 3.5 - 5.1 mmol/L 4.4  Chloride 101 - 111 mmol/L 102  CO2 22 - 32 mmol/L 32  Calcium 8.9 - 10.3 mg/dL 9.1    CBC Latest Ref Rng & Units 05/24/2017  WBC 3.8 - 10.6 K/uL 4.6  Hemoglobin 13.0 - 18.0 g/dL 13.1  Hematocrit 40.0 - 52.0 % 37.9(L)  Platelets 150 - 440 K/uL 212     CXR(03/20/17):  Right lower lobe, well-circumscribed nodule with central calcification. Vague ill-defined opacity in left lower lobe.  IMPRESSION:     ICD-10-CM   1. Mediastinal lymphadenopathy R59.0   2. Left lower lobe irregular, thick walled, cavitary lesion J98.4 CT BIOPSY    Quantiferon tb gold assay  3. Right lower lobe calcified, well-circumscribed nodule consistent with granulomatous disease R91.1    In and of itself, the left lower lobe lesion and left hilar adenopathy could represent sequelae of a cavitating pneumonia. The right lower lobe nodule looks to be consistent with old granulomatous disease which, in light of his travels to [place], would raise concern for old TB. However, the mildly elevated PSA (after prostatectomy) and bone lesions noted on PET scan are worrisome for malignancy.  He was discussed at tumor board on 07/19 and it was agreed that a biopsy of the left lower lobe lesion is recommended. I discussed with him options of obtaining this biopsy including navigation bronchoscopy versus CT-guided biopsy. After review of these options, we agreed to proceed with CT-guided biopsy by interventional radiology.   PLAN:  QuantiFERON gold ordered Referred to IR for CT-guided biopsy of left lower lobe lesion Follow-up will be arranged as needed   Merton Border, MD PCCM service Mobile [phone] Pager [phone] 05/26/2017

## 2017-05-30 LAB — QUANTIFERON IN TUBE
QFT TB AG MINUS NIL VALUE: 0 [IU]/mL
QUANTIFERON MITOGEN VALUE: >10 IU/mL
QUANTIFERON NIL VALUE: 0.04 [IU]/mL
QUANTIFERON TB AG VALUE: 0.04 IU/mL
QUANTIFERON TB GOLD: NEGATIVE

## 2017-05-30 LAB — QUANTIFERON TB GOLD ASSAY (BLOOD)

## 2017-06-03 ENCOUNTER — Telehealth: Payer: Self-pay | Admitting: *Deleted

## 2017-06-03 NOTE — Telephone Encounter (Signed)
Called pt to give appt information for biopsy and follow up with Dr. Jacinto Reap. Pt did not answer. Message left with appt details. Instructed to call back with further questions.

## 2017-06-03 NOTE — Telephone Encounter (Signed)
Noted  

## 2017-06-03 NOTE — Telephone Encounter (Signed)
Pt called back to state has developed cold while out of town and biopsy has been rescheduled to Monday 8/6. Pt informed will move f/u with Dr. B to the following Friday 8/10. Scheduling will notify pt with what time to arrive for appt. Pt questioned if needed to keep appt on 8/8 for CT Sim with Dr. Baruch Gouty. Pt informed will ask Dr. Olena Leatherwood team and will let him know if that appt has been changed. Pt verbalized understanding.

## 2017-06-04 ENCOUNTER — Ambulatory Visit: Admission: RE | Admit: 2017-06-04 | Payer: Medicare Other | Source: Ambulatory Visit

## 2017-06-07 ENCOUNTER — Other Ambulatory Visit: Payer: Self-pay | Admitting: Radiology

## 2017-06-07 ENCOUNTER — Ambulatory Visit: Payer: Medicare Other | Admitting: Internal Medicine

## 2017-06-10 ENCOUNTER — Ambulatory Visit
Admission: RE | Admit: 2017-06-10 | Discharge: 2017-06-10 | Disposition: A | Discharge status: Home / Self Care | Payer: Medicare Other | Source: Ambulatory Visit | Attending: Interventional Radiology | Admitting: Interventional Radiology

## 2017-06-10 ENCOUNTER — Ambulatory Visit
Admission: RE | Admit: 2017-06-10 | Discharge: 2017-06-10 | Disposition: A | Discharge status: Home / Self Care | Payer: Medicare Other | Source: Ambulatory Visit | Attending: Internal Medicine | Admitting: Internal Medicine

## 2017-06-10 ENCOUNTER — Telehealth: Payer: Self-pay | Admitting: *Deleted

## 2017-06-10 DIAGNOSIS — Z7982 Long term (current) use of aspirin: Secondary | ICD-10-CM | POA: Insufficient documentation

## 2017-06-10 DIAGNOSIS — Z8546 Personal history of malignant neoplasm of prostate: Secondary | ICD-10-CM | POA: Diagnosis not present

## 2017-06-10 DIAGNOSIS — C3432 Malignant neoplasm of lower lobe, left bronchus or lung: Secondary | ICD-10-CM | POA: Diagnosis not present

## 2017-06-10 DIAGNOSIS — J984 Other disorders of lung: Secondary | ICD-10-CM

## 2017-06-10 DIAGNOSIS — Z79899 Other long term (current) drug therapy: Secondary | ICD-10-CM | POA: Insufficient documentation

## 2017-06-10 DIAGNOSIS — C7951 Secondary malignant neoplasm of bone: Secondary | ICD-10-CM

## 2017-06-10 DIAGNOSIS — I252 Old myocardial infarction: Secondary | ICD-10-CM | POA: Diagnosis not present

## 2017-06-10 DIAGNOSIS — C61 Malignant neoplasm of prostate: Secondary | ICD-10-CM

## 2017-06-10 DIAGNOSIS — E785 Hyperlipidemia, unspecified: Secondary | ICD-10-CM | POA: Diagnosis not present

## 2017-06-10 DIAGNOSIS — R918 Other nonspecific abnormal finding of lung field: Secondary | ICD-10-CM | POA: Diagnosis present

## 2017-06-10 DIAGNOSIS — Z9889 Other specified postprocedural states: Secondary | ICD-10-CM

## 2017-06-10 LAB — CBC
HCT: 33.4 % — ABNORMAL LOW (ref 40.0–52.0)
Hemoglobin: 11.5 g/dL — ABNORMAL LOW (ref 13.0–18.0)
MCH: 31.1 pg (ref 26.0–34.0)
MCHC: 34.4 g/dL (ref 32.0–36.0)
MCV: 90.5 fL (ref 80.0–100.0)
PLATELETS: 181 10*3/uL (ref 150–440)
RBC: 3.69 MIL/uL — ABNORMAL LOW (ref 4.40–5.90)
RDW: 14.7 % — AB (ref 11.5–14.5)
WBC: 6.2 10*3/uL (ref 3.8–10.6)

## 2017-06-10 LAB — APTT: APTT: 34 s (ref 24–36)

## 2017-06-10 LAB — PROTIME-INR
INR: 1.05
PROTHROMBIN TIME: 13.7 s (ref 11.4–15.2)

## 2017-06-10 MED ORDER — MIDAZOLAM HCL 5 MG/5ML IJ SOLN
INTRAMUSCULAR | Status: AC
  Filled 2017-06-10: qty 5

## 2017-06-10 MED ORDER — FENTANYL CITRATE (PF) 100 MCG/2ML IJ SOLN
INTRAMUSCULAR | Status: DC | PRN
  Administered 2017-06-10: 50 ug via INTRAVENOUS

## 2017-06-10 MED ORDER — MIDAZOLAM HCL 5 MG/5ML IJ SOLN
INTRAMUSCULAR | Status: DC | PRN
  Administered 2017-06-10: 1 mg via INTRAVENOUS

## 2017-06-10 MED ORDER — SODIUM CHLORIDE 0.9 % IV SOLN
INTRAVENOUS | Status: DC
  Administered 2017-06-10: 1000 mL via INTRAVENOUS

## 2017-06-10 MED ORDER — FENTANYL CITRATE (PF) 100 MCG/2ML IJ SOLN
INTRAMUSCULAR | Status: AC
  Filled 2017-06-10: qty 2

## 2017-06-10 NOTE — H&P (Signed)
Chief Complaint: Patient was seen in consultation today for No chief complaint on file.  at the request of Brahmanday,Govinda R  Referring Physician(s): Cammie Sickle  Supervising Physician: Marybelle Killings  Patient Status: [facility] - Out-pt  History of Present Illness: Mack Alvidrez. is a 76 y.o. male with a history of cough and a cavitary LLL lung mass. He recently went to [place]. Tb testing is negative so far.  Past Medical History:  Diagnosis Date  . Hyperlipidemia   . Lung cancer ([place])   . Myocardial infarction ([place])   . Prostate cancer [facility])     Past Surgical History:  Procedure Laterality Date  . PROSTATECTOMY      Allergies: Patient has no known allergies.  Medications: Prior to Admission medications   Medication Sig Start Date End Date Taking? Authorizing Provider  aspirin EC 81 MG tablet Take by mouth.   Yes [provider]  lisinopril (PRINIVIL,ZESTRIL) 5 MG tablet TAKE ONE (1) TABLET EACH DAY 01/07/17  Yes [provider]  metoprolol tartrate (LOPRESSOR) 25 MG tablet TAKE ONE TABLET TWICE DAILY 01/07/17  Yes [provider]  Multiple Vitamins-Minerals (EQL VISION FORMULA) TABS Take by mouth.   Yes [provider]  rosuvastatin (CRESTOR) 10 MG tablet TAKE ONE (1) TABLET EACH DAY 06/27/16  Yes [provider]  traMADol (ULTRAM) 50 MG tablet  05/17/17  Yes [provider]     Family History  Problem Relation Age of Onset  . Cancer Maternal Uncle        colon    Social History   Social History  . Marital status: Married    Spouse name: N/A  . Number of children: N/A  . Years of education: N/A   Social History Main Topics  . Smoking status: Never Smoker  . Smokeless tobacco: Never Used  . Alcohol use 0.6 oz/week    1 Glasses of wine per week     Comment: "occasional"  . Drug use: No  . Sexual activity: Not Asked   Other Topics Concern  . None   Social History Narrative  . None      Review of Systems: A 12 point ROS discussed and pertinent positives are indicated in the HPI above.  All other systems are negative.  Review of Systems  Vital Signs: BP (!) 141/77   Pulse (!) 45   Temp 98 F (36.7 C)   Resp (!) 23   Ht 5' 9.5" (1.765 m)   Wt 190 lb (86.2 kg)   SpO2 94%   BMI 27.66 kg/m   Physical Exam  Constitutional: He is oriented to person, place, and time. He appears well-developed and well-nourished.  HENT:  Head: Normocephalic and atraumatic.  Cardiovascular: Normal rate and regular rhythm.   Pulmonary/Chest: Effort normal and breath sounds normal.  Musculoskeletal: Normal range of motion.  Neurological: He is alert and oriented to person, place, and time.    Mallampati Score:   1  Imaging: Mr Jeri Cos Wo Contrast  Result Date: 05/24/2017 CLINICAL DATA:  Left lower lobe lung mass with bone metastases. Evaluation for brain metastases. EXAM: MRI HEAD WITHOUT AND WITH CONTRAST TECHNIQUE: Multiplanar, multiecho pulse sequences of the brain and surrounding structures were obtained without and with intravenous contrast. CONTRAST:  30mL MULTIHANCE GADOBENATE DIMEGLUMINE 529 MG/ML IV SOLN COMPARISON:  None. FINDINGS: Brain: There is no evidence of acute infarct, intracranial hemorrhage, mass, midline shift, or extra-axial fluid collection. Generalized cerebral atrophy is mild for  age. A few scattered cerebral white matter T2 hyperintensities are within normal limits for age. No abnormal enhancement is identified. Vascular: Major intracranial vascular flow voids are preserved. Skull and upper cervical spine: Unremarkable bone marrow signal. Sinuses/Orbits: Bilateral cataract extraction. Trace right mastoid fluid. Clear paranasal sinuses. Other: None. IMPRESSION: No evidence of intracranial metastases. Electronically Signed   By: Logan Bores M.D.   On: 05/24/2017 17:18   Nm Pet Image Initial (pi) Skull Base To Thigh  Result Date: 05/15/2017 CLINICAL DATA:   Initial treatment strategy for lung mass. History of prostate cancer. EXAM: NUCLEAR MEDICINE PET SKULL BASE TO THIGH TECHNIQUE: 12.9 mCi F-18 FDG was injected intravenously. Full-ring PET imaging was performed from the skull base to thigh after the radiotracer. CT data was obtained and used for attenuation correction and anatomic localization. FASTING BLOOD GLUCOSE:  Value: 93 mg/dl COMPARISON:  Chest CT 05/06/2017 FINDINGS: NECK No hypermetabolic lymph nodes in the neck. CHEST The dominant superior segment left lower lobe cavitary nodule measures 2.7 by 2.0 cm on image 92/3 and has a maximum standard uptake value of 8.8. There is hypermetabolic left hilar, left infrahilar, and subcarinal adenopathy along with a faintly hypermetabolic right paratracheal lymph node. Index subcarinal lymph node measures 1.2 cm in short axis on image 94/3 and has a maximum standard uptake value 11.5. A densely calcified right lower lobe nodule measuring 1.4 cm in diameter is not hypermetabolic, and may reflect old granulomatous disease. Mild airway thickening is present. There is a 0.6 by 0.4 cm left upper lobe nodule on image 85/3 which is not perceptibly hypermetabolic but which is below sensitive PET-CT size thresholds. Coronary, aortic arch, and branch vessel atherosclerotic vascular disease. Partially calcified left thyroid nodule measures up to 1.7 cm in long axis but is not hypermetabolic. Note is made of a cutaneous and subcutaneous cystic lesion along the chest just to the left of midline on image 90/3, without associated hypermetabolic activity, likely a sebaceous cyst or similar benign lesion. ABDOMEN/PELVIS No abnormal hypermetabolic activity within the liver, pancreas, adrenal glands, or spleen. No hypermetabolic lymph nodes in the abdomen or pelvis. Photopenic cyst inferiorly in the right hepatic lobe. Sigmoid colon diverticulosis.  Prostatectomy. Aortoiliac atherosclerotic vascular disease. Descending colon  diverticulosis. SKELETON Hypermetabolic osseous metastatic disease is noted, with dominant lesions involving the left medial clavicle, right sternum, right T8 transverse process, ribs, and left proximal and mid femoral shaft. The index left medial clavicular lesion has a maximum SUV of 11.7. For the most part these metastatic lesions are mildly sclerotic. IMPRESSION: 1. The left lower lobe cavitary nodule is hypermetabolic with a maximum SUV of 8.8, and is competent by left hilar, infrahilar, and subcarinal hypermetabolic adenopathy as well as scattered hypermetabolic osseous metastatic disease. Lung cancer such as squamous cell carcinoma, or metastatic prostate cancer might cause this appearance. 2.  Aortic Atherosclerosis (ICD10-I70.0).  Coronary atherosclerosis. 3. Descending and sigmoid colon diverticulosis. 4. Prostatectomy. 5. Left thyroid nodule was present but is not hypermetabolic on PET-CT. Electronically Signed   By: Van Clines M.D.   On: 05/15/2017 14:30    Labs:  CBC:  Recent Labs  05/24/17 1106 06/10/17 0802  WBC 4.6 6.2  HGB 13.1 11.5*  HCT 37.9* 33.4*  PLT 212 181    COAGS:  Recent Labs  05/24/17 1106 06/10/17 0802  INR 1.06 1.05  APTT 35 34    BMP:  Recent Labs  05/24/17 1106  NA 139  K 4.4  CL 102  CO2 32  GLUCOSE 111*  BUN 17  CALCIUM 9.1  CREATININE 0.94  GFRNONAA >60  GFRAA >60    LIVER FUNCTION TESTS:  Recent Labs  05/24/17 1106  BILITOT 0.6  AST 16  ALT 10*  ALKPHOS 131*  PROT 6.8  ALBUMIN 3.9    TUMOR MARKERS: No results for input(s): AFPTM, CEA, CA199, CHROMGRNA in the last 8760 hours.  Assessment and Plan:  LLL lung mass. CT guided biopsy to follow.  Electronically Signed: Jancarlo Biermann, ART A, MD 06/10/2017, 8:30 AM   I spent a total of    40 Minutes   in face to face in clinical consultation, greater than 50% of which was counseling/coordinating care for  Lung mass.

## 2017-06-10 NOTE — Procedures (Signed)
LLL lung mas Bx 18 g core times two EBL 0 Comp 0

## 2017-06-10 NOTE — Telephone Encounter (Signed)
Simulation appointment rescheduled to Monday 8/13 at 10:00.  Message left on home voicemail.

## 2017-06-12 ENCOUNTER — Ambulatory Visit: Payer: Medicare Other

## 2017-06-12 ENCOUNTER — Other Ambulatory Visit: Payer: Self-pay | Admitting: Pathology

## 2017-06-13 LAB — SURGICAL PATHOLOGY

## 2017-06-14 ENCOUNTER — Inpatient Hospital Stay: Payer: Medicare Other | Attending: Internal Medicine | Admitting: Internal Medicine

## 2017-06-14 ENCOUNTER — Encounter: Payer: Self-pay | Admitting: *Deleted

## 2017-06-14 VITALS — BP 134/73 | HR 52 | Temp 97.6°F | Resp 20 | Ht 69.5 in | Wt 192.0 lb

## 2017-06-14 DIAGNOSIS — Z7982 Long term (current) use of aspirin: Secondary | ICD-10-CM | POA: Diagnosis not present

## 2017-06-14 DIAGNOSIS — C61 Malignant neoplasm of prostate: Secondary | ICD-10-CM | POA: Diagnosis not present

## 2017-06-14 DIAGNOSIS — J8410 Pulmonary fibrosis, unspecified: Secondary | ICD-10-CM | POA: Insufficient documentation

## 2017-06-14 DIAGNOSIS — Z9079 Acquired absence of other genital organ(s): Secondary | ICD-10-CM | POA: Diagnosis not present

## 2017-06-14 DIAGNOSIS — Z79899 Other long term (current) drug therapy: Secondary | ICD-10-CM | POA: Diagnosis not present

## 2017-06-14 DIAGNOSIS — C7951 Secondary malignant neoplasm of bone: Secondary | ICD-10-CM | POA: Diagnosis not present

## 2017-06-14 DIAGNOSIS — C3432 Malignant neoplasm of lower lobe, left bronchus or lung: Secondary | ICD-10-CM | POA: Insufficient documentation

## 2017-06-14 DIAGNOSIS — K5730 Diverticulosis of large intestine without perforation or abscess without bleeding: Secondary | ICD-10-CM | POA: Insufficient documentation

## 2017-06-14 DIAGNOSIS — E041 Nontoxic single thyroid nodule: Secondary | ICD-10-CM | POA: Diagnosis not present

## 2017-06-14 DIAGNOSIS — I252 Old myocardial infarction: Secondary | ICD-10-CM | POA: Diagnosis not present

## 2017-06-14 DIAGNOSIS — E785 Hyperlipidemia, unspecified: Secondary | ICD-10-CM | POA: Insufficient documentation

## 2017-06-14 DIAGNOSIS — I700 Atherosclerosis of aorta: Secondary | ICD-10-CM | POA: Insufficient documentation

## 2017-06-14 NOTE — Progress Notes (Signed)
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 76 y.o.  male with a history of coronary artery disease; and a history of prostate cancer [2000]; And also new diagnosis of left lung nodule/ hilar/mediastinal lymph nodes status post biopsy. He is here to review the results of the lung biopsy.   Overall patient is doing well. He admits to mild-to-moderate pain in his left thigh/hip. Patient is awaiting radiation oncology simulation next week. He has been taking Tylenol and tramadol for the pain.   Patient's appetite is otherwise good. No weight loss. No nausea no vomiting or headaches. No chest pain.   ROS: A complete 10 point review of system is done which is negative except mentioned above in history of present illness  MEDICAL HISTORY:  Past Medical History:  Diagnosis Date  . Hyperlipidemia   . Lung cancer ([place])   . Myocardial infarction ([place])   . Prostate cancer ([place])     SURGICAL HISTORY:Prostatectomy [2001]; cardiac catheter [2010] Past Surgical History:  Procedure Laterality Date  . PROSTATECTOMY      SOCIAL HISTORY: worked for [employer] 38 years; [place] with family. He  never smoked. Occasional alcohol. Social History   Social History  . Marital status: Married    Spouse name: N/A  . Number of children: N/A  . Years of education: N/A   Occupational History  . Not on file.   Social History Main Topics  . Smoking status: Never Smoker  . Smokeless tobacco: Never Used  . Alcohol use 0.6 oz/week    1 Glasses of wine per week     Comment: "occasional"  . Drug use: No  . Sexual activity: Not on file   Other Topics Concern  . Not on file   Social History Narrative  . No narrative on file    FAMILY HISTORY: pat grand mother- ? Cancer;  Family History  Problem Relation Age of Onset  . Cancer Maternal Uncle        colon    ALLERGIES:  has No Known Allergies.  MEDICATIONS:  Current Outpatient Prescriptions  Medication Sig Dispense Refill  . aspirin EC 81 MG tablet Take by mouth.    Marland Kitchen lisinopril (PRINIVIL,ZESTRIL) 5 MG tablet TAKE ONE (1) TABLET EACH DAY    . metoprolol tartrate (LOPRESSOR) 25 MG tablet TAKE ONE TABLET TWICE DAILY    . Multiple Vitamins-Minerals (EQL VISION FORMULA) TABS Take by mouth.    . rosuvastatin (CRESTOR) 10 MG tablet TAKE ONE (1) TABLET EACH DAY    . traMADol (ULTRAM) 50 MG tablet      No current facility-administered medications for this visit.       Marland Kitchen  PHYSICAL EXAMINATION: ECOG PERFORMANCE STATUS: 1 - Symptomatic but completely ambulatory  Vitals:   06/14/17 1013  BP: 134/73  Pulse: (!) 52  Resp: 20  Temp: 97.6 F (36.4 C)   Filed Weights   06/14/17 1013  Weight: 192 lb (87.1 kg)    GENERAL: Well-nourished well-developed; Alert, no distress and comfortable.   Accompanied by his wife. EYES: no pallor or icterus OROPHARYNX: no thrush or ulceration; good dentition  NECK: supple, no masses felt LYMPH:  no palpable lymphadenopathy in the cervical, axillary or inguinal regions LUNGS: clear to auscultation and  No wheeze or crackles HEART/CVS: regular rate & rhythm and no murmurs; No lower extremity  edema ABDOMEN: abdomen soft, non-tender and normal bowel sounds Musculoskeletal:no cyanosis of digits and no clubbing  PSYCH: alert & oriented x 3 with fluent speech NEURO: no focal motor/sensory deficits SKIN:  no rashes or significant lesions  LABORATORY DATA:  I have reviewed the data as listed Lab Results  Component Value Date   WBC 6.2 06/10/2017   HGB 11.5 (L) 06/10/2017   HCT 33.4 (L) 06/10/2017   MCV 90.5 06/10/2017   PLT 181 06/10/2017    Recent Labs  05/24/17 1106  NA 139  K 4.4  CL 102  CO2 32  GLUCOSE 111*  BUN 17  CREATININE 0.94  CALCIUM 9.1  GFRNONAA >60  GFRAA >60  PROT 6.8  ALBUMIN 3.9  AST 16  ALT 10*  ALKPHOS 131*  BILITOT 0.6    RADIOGRAPHIC STUDIES: I have personally reviewed the radiological images as listed and agreed with the findings in the report. Mr Jeri Cos Wo Contrast  Result Date: 05/24/2017 CLINICAL DATA:  Left lower lobe lung mass with bone metastases. Evaluation for brain metastases. EXAM: MRI HEAD WITHOUT AND WITH CONTRAST TECHNIQUE: Multiplanar, multiecho pulse sequences of the brain and surrounding structures were obtained without and with intravenous contrast. CONTRAST:  45mL MULTIHANCE GADOBENATE DIMEGLUMINE 529 MG/ML IV SOLN COMPARISON:  None. FINDINGS: Brain: There is no evidence of acute infarct, intracranial hemorrhage, mass, midline shift, or extra-axial fluid collection. Generalized cerebral atrophy is mild for age. A few scattered cerebral white matter T2 hyperintensities are within normal limits for age. No abnormal enhancement is identified. Vascular: Major intracranial vascular flow voids are preserved. Skull and upper cervical spine: Unremarkable bone marrow signal. Sinuses/Orbits: Bilateral cataract extraction. Trace right mastoid fluid. Clear paranasal sinuses. Other: None. IMPRESSION: No evidence of intracranial metastases. Electronically Signed   By: Logan Bores M.D.   On: 05/24/2017 17:18   Nm Pet Image Initial (pi)  Skull Base To Thigh  Result Date: 05/15/2017 CLINICAL DATA:  Initial treatment strategy for lung mass. History of prostate cancer. EXAM: NUCLEAR MEDICINE PET SKULL BASE TO THIGH TECHNIQUE: 12.9 mCi F-18 FDG was injected intravenously. Full-ring PET imaging was performed from the skull base to thigh after the radiotracer. CT data was obtained and used for attenuation correction and anatomic localization. FASTING BLOOD GLUCOSE:  Value: 93 mg/dl COMPARISON:  Chest CT 05/06/2017 FINDINGS: NECK No hypermetabolic lymph nodes in the neck. CHEST The dominant superior segment left lower lobe cavitary nodule measures 2.7 by 2.0 cm on image 92/3 and has a maximum standard uptake value of 8.8. There is hypermetabolic left hilar, left infrahilar, and subcarinal adenopathy along with a faintly hypermetabolic right paratracheal lymph node. Index subcarinal lymph node measures 1.2 cm in short axis on image 94/3 and has a maximum standard uptake value 11.5. A densely calcified right lower lobe nodule measuring 1.4  cm in diameter is not hypermetabolic, and may reflect old granulomatous disease. Mild airway thickening is present. There is a 0.6 by 0.4 cm left upper lobe nodule on image 85/3 which is not perceptibly hypermetabolic but which is below sensitive PET-CT size thresholds. Coronary, aortic arch, and branch vessel atherosclerotic vascular disease. Partially calcified left thyroid nodule measures up to 1.7 cm in long axis but is not hypermetabolic. Note is made of a cutaneous and subcutaneous cystic lesion along the chest just to the left of midline on image 90/3, without associated hypermetabolic activity, likely a sebaceous cyst or similar benign lesion. ABDOMEN/PELVIS No abnormal hypermetabolic activity within the liver, pancreas, adrenal glands, or spleen. No hypermetabolic lymph nodes in the abdomen or pelvis. Photopenic cyst inferiorly in the right hepatic lobe. Sigmoid colon diverticulosis.  Prostatectomy. Aortoiliac  atherosclerotic vascular disease. Descending colon diverticulosis. SKELETON Hypermetabolic osseous metastatic disease is noted, with dominant lesions involving the left medial clavicle, right sternum, right T8 transverse process, ribs, and left proximal and mid femoral shaft. The index left medial clavicular lesion has a maximum SUV of 11.7. For the most part these metastatic lesions are mildly sclerotic. IMPRESSION: 1. The left lower lobe cavitary nodule is hypermetabolic with a maximum SUV of 8.8, and is competent by left hilar, infrahilar, and subcarinal hypermetabolic adenopathy as well as scattered hypermetabolic osseous metastatic disease. Lung cancer such as squamous cell carcinoma, or metastatic prostate cancer might cause this appearance. 2.  Aortic Atherosclerosis (ICD10-I70.0).  Coronary atherosclerosis. 3. Descending and sigmoid colon diverticulosis. 4. Prostatectomy. 5. Left thyroid nodule was present but is not hypermetabolic on PET-CT. Electronically Signed   By: Van Clines M.D.   On: 05/15/2017 14:30   Ct Biopsy  Result Date: 06/10/2017 INDICATION: Left lower lobe lung mass EXAM: CT BIOPSY MEDICATIONS: None. ANESTHESIA/SEDATION: Fentanyl 50 mcg IV; Versed 1 mg IV Moderate Sedation Time:  10 The patient was continuously monitored during the procedure by the interventional radiology nurse under my direct supervision. FLUOROSCOPY TIME:  Fluoroscopy Time:  minutes  seconds ( mGy). COMPLICATIONS: None immediate. PROCEDURE: Informed written consent was obtained from the patient after a thorough discussion of the procedural risks, benefits and alternatives. All questions were addressed. Maximal Sterile Barrier Technique was utilized including caps, mask, sterile gowns, sterile gloves, sterile drape, hand hygiene and skin antiseptic. A timeout was performed prior to the initiation of the procedure. Under CT guidance, a(n) 17 gauge guide needle was advanced into the left lower lobe lung mass.  Subsequently 2 18 gauge core biopsies were obtained. The guide needle was removed. Post biopsy images demonstrate no pneumothorax. Patient tolerated the procedure well without complication. Vital sign monitoring by nursing staff during the procedure will continue as patient is in the special procedures unit for post procedure observation. FINDINGS: The images document guide needle placement within the left lower lobe lung mass. Post biopsy images demonstrate no pneumothorax. IMPRESSION: Successful CT-guided left lower lobe lung mass core biopsy Electronically Signed   By: Marybelle Killings M.D.   On: 06/10/2017 10:01   Dg Chest Port 1 View  Result Date: 06/10/2017 CLINICAL DATA:  Post biopsy EXAM: PORTABLE CHEST 1 VIEW COMPARISON:  03/20/2017 FINDINGS: No pneumothorax post left lung biopsy. Lungs are under aerated with bibasilar atelectasis. No pleural effusion. Normal heart size. Calcified granuloma at the right lung base. IMPRESSION: No pneumothorax post biopsy. Electronically Signed   By: Marybelle Killings M.D.   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 Recommend Foundation 1 testing; especially as patient is a never smoker.  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 PSA 9; not on Lupron. This unfortunately is incurable.  The treatment options include Lupron; addition of docetaxel chemotherapy/ abi [C discussion below].   # Long discussion the patient/an wife regarding the presence of 2 primaries- where  Stage III lung cancer- treated with chemoradiation; with a definitive intent- although cure rate is about 10-20% only. However this has to be treated in the context of the patient's likely metastatic prostate cancer to the bone.   # Proceed with chemoradiation [with weekly carbo-taxol] to the lung; discussed  with Dr. Donella Stade. Discussed re: port. Chemo education.   # Discussed the potential side effects including but not limited to-increasing fatigue, nausea vomiting, diarrhea, hair loss, sores in the mouth, increase risk of infection and also neuropathy.   # Patient follow-up with me August 24/chemotherapy carbotaxol/labs. Discussed with RN navigator.   # I reviewed the blood work- with the patient in detail; also reviewed the imaging independently [as summarized above]; and with the patient in detail.   All questions were answered. The patient knows to call the clinic with any problems, questions or concerns.    Cammie Sickle, MD 06/14/2017 1:52 PM

## 2017-06-14 NOTE — Progress Notes (Signed)
  Oncology Nurse Navigator Documentation  Navigator Location: [facility] (06/14/17 1200)   )Navigator Encounter Type: [facility] Follow-up (06/14/17 1200)   Abnormal Finding Date: 05/06/17 (06/14/17 1200) Confirmed Diagnosis Date: 06/13/17 (06/14/17 1200)                 Treatment Phase: Pre-Tx/Tx Discussion (06/14/17 1200) Barriers/Navigation Needs: Coordination of Care (06/14/17 1200)   Interventions: Coordination of Care (06/14/17 1200)   Coordination of Care: Appts;Radiology (06/14/17 1200)        Acuity: Level 2 (06/14/17 1200)   Acuity Level 2: Initial guidance, education and coordination as needed;Educational needs;Assistance expediting appointments (06/14/17 1200)    Met with pt for follow up appt with Dr. Rogue Bussing to discuss biopsy results. All questions answered at the time of visit. Assisted pt in expediting appts. All upcoming appts reviewed with the patient. Pt given educational booklets regarding diagnosis and given info regarding supportive services available. Informed pt to call with any further questions or needs. Pt verbalized understanding.   Time Spent with Patient: 60 (06/14/17 1200)

## 2017-06-14 NOTE — Assessment & Plan Note (Addendum)
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 Recommend Foundation 1 testing; especially as patient is a never smoker.  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 PSA 9; not on Lupron. This unfortunately is incurable.  The treatment options include Lupron; addition of docetaxel chemotherapy/ abi [C discussion below].   # Long discussion the patient/an wife regarding the presence of 2 primaries- where  Stage III lung cancer- treated with chemoradiation; with a definitive intent- although cure rate is about 10-20% only. However this has to be treated in the context of the patient's likely metastatic prostate cancer to the bone.   # Proceed with chemoradiation [with weekly carbo-taxol] to the lung; discussed with Dr. Donella Stade. Discussed re: port. Chemo education.   # Discussed the potential side effects including but not limited to-increasing fatigue, nausea vomiting, diarrhea, hair loss, sores in the mouth, increase risk of infection and also neuropathy.   # Patient follow-up with me August 24/chemotherapy carbotaxol/labs. Discussed with RN navigator.   # I reviewed the blood work- with the patient in detail; also reviewed the imaging independently [as summarized above]; and with the patient in detail.

## 2017-06-17 ENCOUNTER — Ambulatory Visit: Payer: Medicare Other

## 2017-06-18 NOTE — Patient Instructions (Signed)

## 2017-06-20 ENCOUNTER — Inpatient Hospital Stay: Payer: Medicare Other

## 2017-06-20 ENCOUNTER — Other Ambulatory Visit: Payer: Self-pay | Admitting: *Deleted

## 2017-06-21 ENCOUNTER — Telehealth: Payer: Self-pay | Admitting: *Deleted

## 2017-06-21 NOTE — Telephone Encounter (Signed)
Ebony at Dr. Lavone Neri Smith's office called. They will see the patient on Monday 8/20 for NP apt for port placement scheduling. Dr. Tamala Julian is not available until either 8/23 or 8/24 or 8/28  to place port. Pt already has apts on these days. Patient has pet on 8/23- this pet can not be moved due to the type of scan needed. RN spoke with Dr. Jacinto Reap- leave chemo as scheduled on 8/24 for his first weekly carbo/taxol. Pt can get first cycle of chemo peripherally, then Dr. Tamala Julian could tentatively plan port placement for 8/28.   Ebony made aware and will contact our office once port is set up.

## 2017-06-24 ENCOUNTER — Encounter: Payer: Self-pay | Admitting: Internal Medicine

## 2017-06-25 ENCOUNTER — Encounter: Payer: Self-pay | Admitting: Internal Medicine

## 2017-06-26 ENCOUNTER — Encounter
Admission: RE | Admit: 2017-06-26 | Discharge: 2017-06-26 | Disposition: A | Discharge status: Home / Self Care | Payer: Medicare Other | Source: Ambulatory Visit | Attending: Surgery | Admitting: Surgery

## 2017-06-26 ENCOUNTER — Other Ambulatory Visit: Payer: Self-pay | Admitting: Internal Medicine

## 2017-06-26 DIAGNOSIS — R001 Bradycardia, unspecified: Secondary | ICD-10-CM | POA: Insufficient documentation

## 2017-06-26 DIAGNOSIS — I440 Atrioventricular block, first degree: Secondary | ICD-10-CM | POA: Insufficient documentation

## 2017-06-26 DIAGNOSIS — I252 Old myocardial infarction: Secondary | ICD-10-CM | POA: Insufficient documentation

## 2017-06-26 DIAGNOSIS — Z01818 Encounter for other preprocedural examination: Secondary | ICD-10-CM | POA: Insufficient documentation

## 2017-06-26 DIAGNOSIS — C3432 Malignant neoplasm of lower lobe, left bronchus or lung: Secondary | ICD-10-CM | POA: Diagnosis not present

## 2017-06-26 HISTORY — DX: Personal history of other diseases of the digestive system: Z87.19

## 2017-06-26 HISTORY — DX: Unspecified malignant neoplasm of skin of other part of trunk: C44.509

## 2017-06-26 NOTE — Progress Notes (Signed)
START ON PATHWAY REGIMEN - Non-Small Cell Lung     Administer weekly:     Paclitaxel      Carboplatin   **Always confirm dose/schedule in your pharmacy ordering system**    Patient Characteristics: Stage III - Unresectable, PS = 0, 1 AJCC T Category: T1c Current Disease Status: No Distant Mets or Local Recurrence AJCC N Category: N2 AJCC M Category: M0 AJCC 8 Stage Grouping: IIIA Performance Status: PS = 0, 1 Intent of Therapy: Non-Curative / Palliative Intent, Discussed with Patient

## 2017-06-26 NOTE — Patient Instructions (Addendum)
  Your procedure is scheduled on: July 02, 2017 [facility] )   Report to Same Day Surgery 2nd floor medical mall [facility] Entrance-take elevator on left to 2nd floor.  Check in with surgery information desk.) To find out your arrival time please call [phone] between 1PM - 3PM on July 01, 2017 [facility] )   Remember: Instructions that are not followed completely may result in serious medical risk, up to and including death, or upon the discretion of your surgeon and anesthesiologist your surgery may need to be rescheduled.    _x___ 1. Do not eat food or drink liquids after midnight. No gum chewing or hard candies                                __x__ 2. No Alcohol for 24 hours before or after surgery.   __x__3. No Smoking for 24 prior to surgery.   ____  4. Bring all medications with you on the day of surgery if instructed.    __x__ 5. Notify your doctor if there is any change in your medical condition     (cold, fever, infections).     Do not wear jewelry, make-up, hairpins, clips or nail polish.  Do not wear lotions, powders, or perfumes.  Do not shave 48 hours prior to surgery. Men may shave face and neck.  Do not bring valuables to the hospital.    [facility] is not responsible for any belongings or valuables.               Contacts, dentures or bridgework may not be worn into surgery.  Leave your suitcase in the car. After surgery it may be brought to your room.  For patients admitted to the hospital, discharge time is determined by your  treatment team                     .   Patients discharged the day of surgery will not be allowed to drive home.  You will need someone to drive you home and stay with you the night of your procedure.    Please read over the following fact sheets that you were given:   [facility] Preparing for Surgery and or MRSA Information   TAKE THE FOLLOWING MEDICATIONS WITH A SIP OF WATER THE MORNING OF SURGERY  1. LISINOPRIL  2.  METOPROLOL  3.  4.  5.  6.  ____Fleets enema or Magnesium Citrate as directed.   _x___ Use CHG Soap or sage wipes as directed on instruction sheet   ____ Use inhalers on the day of surgery and bring to hospital day of surgery  ____ Stop Metformin and Janumet 2 days prior to surgery.    ____ Take 1/2 of usual insulin dose the night before surgery and none on the morning     surgery.   _x___ Follow recommendations from Cardiologist, Pulmonologist or PCP regarding          stopping Aspirin, Coumadin, Plavix ,Eliquis, Effient, or Pradaxa, and Pletal. (PATIENT HAS STOPPED ASPIRIN )  X____Stop Anti-inflammatories such as Advil, Aleve, Ibuprofen, Motrin, Naproxen, Naprosyn, Goodies powders or aspirin products. OK to take Tylenol  (STOP IBUPROFEN NOW  )   _x___ Stop supplements until after surgery.  But may continue Vitamin D, Vitamin B,  and multivitamin        ____ Bring C-Pap to the hospital.

## 2017-06-27 ENCOUNTER — Encounter (HOSPITAL_COMMUNITY)
Admission: RE | Admit: 2017-06-27 | Discharge: 2017-06-27 | Disposition: A | Discharge status: Home / Self Care | Payer: Medicare Other | Source: Ambulatory Visit | Attending: Internal Medicine | Admitting: Internal Medicine

## 2017-06-27 ENCOUNTER — Other Ambulatory Visit: Payer: Self-pay | Admitting: *Deleted

## 2017-06-27 DIAGNOSIS — C61 Malignant neoplasm of prostate: Secondary | ICD-10-CM | POA: Insufficient documentation

## 2017-06-27 DIAGNOSIS — C3432 Malignant neoplasm of lower lobe, left bronchus or lung: Secondary | ICD-10-CM

## 2017-06-27 MED ORDER — AXUMIN (FLUCICLOVINE F 18) INJECTION
10.7000 | Freq: Once | INTRAVENOUS | Status: AC
  Administered 2017-06-27: 10.7 via INTRAVENOUS

## 2017-06-28 ENCOUNTER — Inpatient Hospital Stay (HOSPITAL_BASED_OUTPATIENT_CLINIC_OR_DEPARTMENT_OTHER): Payer: Medicare Other | Admitting: Internal Medicine

## 2017-06-28 ENCOUNTER — Inpatient Hospital Stay: Payer: Medicare Other

## 2017-06-28 VITALS — BP 135/72 | HR 55 | Temp 97.5°F | Resp 20

## 2017-06-28 DIAGNOSIS — Z79899 Other long term (current) drug therapy: Secondary | ICD-10-CM | POA: Diagnosis not present

## 2017-06-28 DIAGNOSIS — C7951 Secondary malignant neoplasm of bone: Secondary | ICD-10-CM | POA: Diagnosis not present

## 2017-06-28 DIAGNOSIS — C3432 Malignant neoplasm of lower lobe, left bronchus or lung: Secondary | ICD-10-CM

## 2017-06-28 DIAGNOSIS — C61 Malignant neoplasm of prostate: Secondary | ICD-10-CM | POA: Diagnosis not present

## 2017-06-28 LAB — CBC WITH DIFFERENTIAL/PLATELET
Basophils Absolute: 0 10*3/uL (ref 0–0.1)
Basophils Relative: 0 %
Eosinophils Absolute: 0.1 10*3/uL (ref 0–0.7)
Eosinophils Relative: 1 %
HEMATOCRIT: 35.9 % — AB (ref 40.0–52.0)
HEMOGLOBIN: 12.7 g/dL — AB (ref 13.0–18.0)
LYMPHS ABS: 0.6 10*3/uL — AB (ref 1.0–3.6)
LYMPHS PCT: 10 %
MCH: 31.3 pg (ref 26.0–34.0)
MCHC: 35.3 g/dL (ref 32.0–36.0)
MCV: 88.7 fL (ref 80.0–100.0)
MONOS PCT: 10 %
Monocytes Absolute: 0.5 10*3/uL (ref 0.2–1.0)
NEUTROS ABS: 4.5 10*3/uL (ref 1.4–6.5)
NEUTROS PCT: 79 %
Platelets: 205 10*3/uL (ref 150–440)
RBC: 4.05 MIL/uL — AB (ref 4.40–5.90)
RDW: 14.8 % — ABNORMAL HIGH (ref 11.5–14.5)
WBC: 5.7 10*3/uL (ref 3.8–10.6)

## 2017-06-28 LAB — COMPREHENSIVE METABOLIC PANEL
ALK PHOS: 128 U/L — AB (ref 38–126)
ALT: 11 U/L — AB (ref 17–63)
ANION GAP: 8 (ref 5–15)
AST: 20 U/L (ref 15–41)
Albumin: 3.7 g/dL (ref 3.5–5.0)
BILIRUBIN TOTAL: 0.6 mg/dL (ref 0.3–1.2)
BUN: 14 mg/dL (ref 6–20)
CALCIUM: 8.8 mg/dL — AB (ref 8.9–10.3)
CO2: 29 mmol/L (ref 22–32)
CREATININE: 0.84 mg/dL (ref 0.61–1.24)
Chloride: 102 mmol/L (ref 101–111)
GFR calc non Af Amer: >60 mL/min (ref >60)
GLUCOSE: 164 mg/dL — AB (ref 65–99)
Potassium: 3.4 mmol/L — ABNORMAL LOW (ref 3.5–5.1)
Sodium: 139 mmol/L (ref 135–145)
TOTAL PROTEIN: 6.6 g/dL (ref 6.5–8.1)

## 2017-06-28 MED ORDER — LIDOCAINE-PRILOCAINE 2.5-2.5 % EX CREA: TOPICAL_CREAM | CUTANEOUS | 3 refills | Status: DC

## 2017-06-28 MED ORDER — PROCHLORPERAZINE MALEATE 10 MG PO TABS: 10.0000 mg | ORAL_TABLET | Freq: Four times a day (QID) | ORAL | 1 refills | Status: DC | PRN

## 2017-06-28 MED ORDER — ONDANSETRON HCL 8 MG PO TABS: 8.0000 mg | ORAL_TABLET | Freq: Two times a day (BID) | ORAL | 1 refills | Status: DC | PRN

## 2017-06-28 NOTE — Progress Notes (Signed)
Pt states that he is very anxious today. He has not slept very well last evening due to shoulder pain. He has held his asa and ibuprofen due to upcoming port a cath procedure. Pt states that he was unaware that he would be receiving chemotherapy today - he thought he would have his port placed first prior to chemotherapy. Pt made aware that RN sent in new rx for emla cream, zofran and compazine scripts this am.

## 2017-06-28 NOTE — Progress Notes (Signed)
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 76 y.o.  male with History of prostate cancer; and a new diagnosis of lung cancer-left lower lobe/mediastinal adenopathy; and also multiple bone lesions is here to review the results of his auximin PET scan.   Patient has intermittent pain in his right shoulder; also left knee.   Patient is awaiting radiation oncology simulation on the 27th. He is currently taking Tylenol/tramadol for pain. Denies any cough or chest pain or difficulty breathing.  Patient's appetite is otherwise good. No weight loss. No nausea no vomiting or headaches.   ROS: A complete 10 point review of system is done which is negative except mentioned above in history of present illness  MEDICAL HISTORY:  Past Medical History:  Diagnosis Date  . History of diverticulosis   . Hyperlipidemia   . Lung cancer ([place])   . Myocardial infarction  ([place])   . Prostate cancer ([place])   . Skin cancer of anterior chest    Left side    SURGICAL HISTORY:Prostatectomy [2001]; cardiac catheter [2010] Past Surgical History:  Procedure Laterality Date  . CARDIAC CATHETERIZATION    . EYE SURGERY Bilateral    Cataract Extraction with IOL  . PROSTATECTOMY      SOCIAL HISTORY: worked for [employer] 38 years; [place] with family. He never smoked. Occasional alcohol. Social History   Social History  . Marital status: Married    Spouse name: N/A  . Number of children: N/A  . Years of education: N/A   Occupational History  . Not on file.   Social History Main Topics  . Smoking status: Never Smoker  . Smokeless tobacco: Never Used  . Alcohol use 0.6 oz/week    1 Glasses of wine per week     Comment: "occasional"  . Drug use: No  . Sexual activity: Not on file   Other Topics Concern  . Not on file   Social History Narrative  . No narrative on file    FAMILY HISTORY: pat grand mother- ? Cancer;  Family History  Problem Relation Age of Onset  . Cancer Maternal Uncle        colon    ALLERGIES:  has No Known Allergies.  MEDICATIONS:  Current Outpatient Prescriptions  Medication Sig Dispense Refill  . acetaminophen (TYLENOL) 500 MG tablet Take 500 mg by mouth every 6 (six) hours as needed (for pain.).    Marland Kitchen lisinopril (PRINIVIL,ZESTRIL) 5 MG tablet Take 5  mg by mouth daily.    . metoprolol tartrate (LOPRESSOR) 25 MG tablet Take 25 mg by mouth 2 (two) times daily.    . Multiple Vitamins-Minerals (PRESERVISION AREDS 2 PO) Take 1 tablet by mouth 2 (two) times daily.    . rosuvastatin (CRESTOR) 10 MG tablet Take 10 mg by mouth every evening.    . terbinafine (LAMISIL) 1 % cream Apply 1 application topically 2 (two) times daily as needed (for athelete's foot).    . traMADol (ULTRAM) 50 MG tablet Take 50 mg by mouth every 6 (six) hours as needed (for pain.).     Marland Kitchen zolpidem (AMBIEN) 5 MG tablet Take 5 mg by mouth at bedtime.    Marland Kitchen aspirin  EC 81 MG tablet Take 81 mg by mouth every evening.    Marland Kitchen ibuprofen (ADVIL,MOTRIN) 200 MG tablet Take 400 mg by mouth every 8 (eight) hours as needed (FOR PAIN.).    Marland Kitchen lidocaine-prilocaine (EMLA) cream Apply to affected area once (Patient not taking: Reported on 06/28/2017) 30 g 3  . ondansetron (ZOFRAN) 8 MG tablet Take 1 tablet (8 mg total) by mouth 2 (two) times daily as needed for refractory nausea / vomiting. Start on day 3 after chemo. (Patient not taking: Reported on 06/28/2017) 30 tablet 1  . prochlorperazine (COMPAZINE) 10 MG tablet Take 1 tablet (10 mg total) by mouth every 6 (six) hours as needed (Nausea or vomiting). (Patient not taking: Reported on 06/28/2017) 30 tablet 1   No current facility-administered medications for this visit.       Marland Kitchen  PHYSICAL EXAMINATION: ECOG PERFORMANCE STATUS: 1 - Symptomatic but completely ambulatory  Vitals:   06/28/17 0845  BP: 135/72  Pulse: (!) 55  Resp: 20  Temp: (!) 97.5 F (36.4 C)   There were no vitals filed for this visit.  GENERAL: Well-nourished well-developed; Alert, no distress and comfortable.   Accompanied by his wife. EYES: no pallor or icterus OROPHARYNX: no thrush or ulceration; good dentition  NECK: supple, no masses felt LYMPH:  no palpable lymphadenopathy in the cervical, axillary or inguinal regions LUNGS: clear to auscultation and  No wheeze or crackles HEART/CVS: regular rate & rhythm and no murmurs; No lower extremity edema ABDOMEN: abdomen soft, non-tender and normal bowel sounds Musculoskeletal:no cyanosis of digits and no clubbing  PSYCH: alert & oriented x 3 with fluent speech NEURO: no focal motor/sensory deficits SKIN:  no rashes or significant lesions  LABORATORY DATA:  I have reviewed the data as listed Lab Results  Component Value Date   WBC 5.7 06/28/2017   HGB 12.7 (L) 06/28/2017   HCT 35.9 (L) 06/28/2017   MCV 88.7 06/28/2017   PLT 205 06/28/2017    Recent Labs  05/24/17 1106 06/28/17 0832   NA 139 139  K 4.4 3.4*  CL 102 102  CO2 32 29  GLUCOSE 111* 164*  BUN 17 14  CREATININE 0.94 0.84  CALCIUM 9.1 8.8*  GFRNONAA >60 >60  GFRAA >60 >60  PROT 6.8 6.6  ALBUMIN 3.9 3.7  AST 16 20  ALT 10* 11*  ALKPHOS 131* 128*  BILITOT 0.6 0.6    RADIOGRAPHIC STUDIES: I have personally reviewed the radiological images as listed and agreed with the findings in the report. Ct Biopsy  Result Date: 06/10/2017 INDICATION: Left lower lobe lung mass EXAM: CT BIOPSY MEDICATIONS: None. ANESTHESIA/SEDATION: Fentanyl 50 mcg IV; Versed 1 mg IV Moderate Sedation Time:  10 The patient was continuously monitored during the procedure by the  interventional radiology nurse under my direct supervision. FLUOROSCOPY TIME:  Fluoroscopy Time:  minutes  seconds ( mGy). COMPLICATIONS: None immediate. PROCEDURE: Informed written consent was obtained from the patient after a thorough discussion of the procedural risks, benefits and alternatives. All questions were addressed. Maximal Sterile Barrier Technique was utilized including caps, mask, sterile gowns, sterile gloves, sterile drape, hand hygiene and skin antiseptic. A timeout was performed prior to the initiation of the procedure. Under CT guidance, a(n) 17 gauge guide needle was advanced into the left lower lobe lung mass. Subsequently 2 18 gauge core biopsies were obtained. The guide needle was removed. Post biopsy images demonstrate no pneumothorax. Patient tolerated the procedure well without complication. Vital sign monitoring by nursing staff during the procedure will continue as patient is in the special procedures unit for post procedure observation. FINDINGS: The images document guide needle placement within the left lower lobe lung mass. Post biopsy images demonstrate no pneumothorax. IMPRESSION: Successful CT-guided left lower lobe lung mass core biopsy Electronically Signed   By: Marybelle Killings M.D.   On: 06/10/2017 10:01   Dg Chest Port 1 View  Result  Date: 06/10/2017 CLINICAL DATA:  Post biopsy EXAM: PORTABLE CHEST 1 VIEW COMPARISON:  03/20/2017 FINDINGS: No pneumothorax post left lung biopsy. Lungs are under aerated with bibasilar atelectasis. No pleural effusion. Normal heart size. Calcified granuloma at the right lung base. IMPRESSION: No pneumothorax post biopsy. Electronically Signed   By: Marybelle Killings M.D.   On: 06/10/2017 10:50   Nm Pet (axumin) Skull Base To Mid Thigh  Addendum Date: 06/28/2017   ADDENDUM REPORT: 06/28/2017 09:09 ADDENDUM: There is a focus of intense activity at the most inferior aspect of the pelvic floor at the site of the prior prostatectomy adjacent to multiple surgical clips. This is concerning for local prostate cancer recurrence. Overall favor metastatic lung cancer metastatic to bone and suspicion of prostate cancer metastatic to bone. Findings [facility] on 06/28/2017  at09:08. Electronically Signed   By: Suzy Bouchard M.D.   On: 06/28/2017 09:09   Result Date: 06/28/2017 CLINICAL DATA:  Prostate carcinoma with biochemical recurrence. LEFT lower lobe lung cancer. EXAM: NUCLEAR MEDICINE PET SKULL BASE TO THIGH TECHNIQUE: 10.7 mCi F-18 Fluciclovine was injected intravenously. Full-ring PET imaging was performed from the skull base to thigh after the radiotracer. CT data was obtained and used for attenuation correction and anatomic localization. COMPARISON:  PET-CT 05/15/2017 FINDINGS: NECK No radiotracer activity in neck lymph nodes. CHEST Focal activity associated with the LEFT lower lobe cavitary nodules and subcarinal lymph node. This is favored activity related to lung cancer. Calcified granuloma in the RIGHT lower lobe. ABDOMEN/PELVIS No focal radiotracer activity within the prostate bed. No abnormal radiotracer accumulation within pelvic lymph nodes. No abnormal activity the liver. No abdominopelvic lymphadenopathy. Physiologic activity noted within the liver and pancreas. SKELETON No discrete focal  uptake within the skeleton to localize prostate cancer metastasis. Moderate uptake within the RIGHT iliac wing is most intense sclerotic lesion at this level. The Hypermetabolic lesions in the comparison FDG PET scan are less radiotracer avid on current fluciclovine scan. For example in the medial LEFT clavicle. There are multiple small sclerotic bone lesions within the pelvis and spine. There are reports of the sclerotic bone metastasis having low avidity for the fluciclovine. IMPRESSION: 1. Multiple small sclerotic lesions in the pelvis and spine in a metastatic pattern. The CT pattern favors prostate cancer metastasis. Lesion in the pelvis are not particularly avid on FDG or  fluciclovine imaging. Leison in the medial LEFT clavicle is more intense on FDG imaging study than the Fluciclovine imaging suggesting lung cancer metastasis. 2. No evidence of soft tissue prostate cancer metastasis or local recurrence in the prostate bed. 3. Uptake in the LEFT lung lesion and subcarinal lymph node is favored related to lung cancer. Electronically Signed: By: Suzy Bouchard M.D. On: 06/27/2017 15:26    ASSESSMENT & PLAN:   Primary cancer of left lower lobe of lung ([place]) # Left lower lobe lung cancer stage III T1 N2 [NSCLC- adenosquamous biopsy proven-LLL]; mediastinal/hilar adenopathy Vs. Stage IV [Auximin PET- ? Left medial clavicle lesion].   # I reviewed the results of the auximin PET scan patient and family in detail. Discussed the possibility of stage III vs stage IV lung cancer. Discussed use of chemoradiation for definitive management for stage III lung cancer; whereas use of chemotherapy immunotherapy for stage IV lung cancer. We'll discuss with radiation oncology before proceeding with the treatment plan.   # Prostate cancer- with likely metastasis to the bone- based on Auximin PET scan. We will start patient on Lupron. We'll hold of chemotherapy for prostate cancer-given the above diagnosis lung cancer.    # Patient understands that unfortunately he has metastatic cancer- lung cancer/prostate cancer- both of them being incurable. The treatments are palliative.  # Metastatic lesions to bone- will benefit from X-geva.   # I also reviewed the imaging/discuss with Dr.Stewart from Nuclear Medicine.  Also discussed with Dr.Herrick from Urology.   # I reviewed the blood work- with the patient in detail; also reviewed the imaging independently [as summarized above]; and with the patient in detail.   # Follow-up to be decided. Will discuss with Dr. Donella Stade- on 8/27.   All questions were answered. The patient knows to call the clinic with any problems, questions or concerns.    Cammie Sickle, MD 06/30/2017 6:30 PM

## 2017-06-28 NOTE — Assessment & Plan Note (Addendum)
#   Left lower lobe lung cancer stage III T1 N2 [NSCLC- adenosquamous biopsy proven-LLL]; mediastinal/hilar adenopathy Vs. Stage IV [Auximin PET- ? Left medial clavicle lesion].   # I reviewed the results of the auximin PET scan patient and family in detail. Discussed the possibility of stage III vs stage IV lung cancer. Discussed use of chemoradiation for definitive management for stage III lung cancer; whereas use of chemotherapy immunotherapy for stage IV lung cancer. We'll discuss with radiation oncology before proceeding with the treatment plan.   # Prostate cancer- with likely metastasis to the bone- based on Auximin PET scan. We will start patient on Lupron. We'll hold of chemotherapy for prostate cancer-given the above diagnosis lung cancer.   # Patient understands that unfortunately he has metastatic cancer- lung cancer/prostate cancer- both of them being incurable. The treatments are palliative.  # Metastatic lesions to bone- will benefit from X-geva.   # I also reviewed the imaging/discuss with Dr.Stewart from Nuclear Medicine.  Also discussed with Dr.Herrick from Urology.   # I reviewed the blood work- with the patient in detail; also reviewed the imaging independently [as summarized above]; and with the patient in detail.   # Follow-up to be decided. Will discuss with Dr. Donella Stade- on 8/27.

## 2017-07-01 ENCOUNTER — Encounter: Payer: Self-pay | Admitting: Radiation Oncology

## 2017-07-01 ENCOUNTER — Ambulatory Visit
Admission: RE | Admit: 2017-07-01 | Discharge: 2017-07-01 | Disposition: A | Discharge status: Home / Self Care | Payer: Medicare Other | Source: Ambulatory Visit | Attending: Radiation Oncology | Admitting: Radiation Oncology

## 2017-07-01 VITALS — BP 137/76 | HR 60 | Temp 96.9°F | Resp 18 | Wt 187.1 lb

## 2017-07-01 DIAGNOSIS — C61 Malignant neoplasm of prostate: Secondary | ICD-10-CM | POA: Insufficient documentation

## 2017-07-01 DIAGNOSIS — M25511 Pain in right shoulder: Secondary | ICD-10-CM | POA: Insufficient documentation

## 2017-07-01 DIAGNOSIS — C7951 Secondary malignant neoplasm of bone: Secondary | ICD-10-CM | POA: Diagnosis not present

## 2017-07-01 DIAGNOSIS — C3432 Malignant neoplasm of lower lobe, left bronchus or lung: Secondary | ICD-10-CM | POA: Diagnosis present

## 2017-07-01 NOTE — Progress Notes (Signed)
Radiation Oncology Follow up Note  Name: George Sako Hutzler Jr.   Date:   07/01/2017 MRN:  [id number] DOB: Oct 04, 1941    This 76 y.o. male presents to the clinic today for follow-up for completion of staging. Staging workup for both prostate cancer and lung cancer  REFERRING PROVIDER: Rusty Aus, MD  HPI: Patient is a 76 year old male really consult back in July when he presented with cough followed by chest x-ray and CT scan showing 3 cm left lung cavitary lesion with PET scan also demonstrating hypermetabolic mass in the left lung as well as mediastinal adenopathy. Patient had widespread bony metastasis and has a known history of prostate cancer status post prostatectomy plus salvage radiation therapy with currently a PSA around 6.. He had a CT-guided biopsy of his lung mass which was positive for non-small cell lung cancer. His case was presented at our tumor conference he underwent a specific prostate PET CT scan with again showing multiple small sclerotic lesions in the pelvis and spine favoring prostate cancer. Interestingly he had a left clavicular lesion more intense on imaging which may represent lung cancer metastasis although not definitively. Prostate bed was clear of evidence of residual disease. I discussed the case personally with medical oncology we will treat the patient as stage III lung cancer also he we treated for stage IV prostate cancer with a DVT therapy. Patient's doing fairly well his pain is under control with current pain regimen. Does have some slight right shoulder pain as well as left lower extremity pain nothing stat significant with films showing no evidence of pending cortical destruction or pending fracture.  COMPLICATIONS OF TREATMENT: none  FOLLOW UP COMPLIANCE: keeps appointments   PHYSICAL EXAM:  BP 137/76   Pulse 60   Temp (!) 96.9 F (36.1 C)   Resp 18   Wt 187 lb 1 oz (84.8 kg)   BMI 27.23 kg/m  Well-developed well-nourished patient in NAD.  HEENT reveals PERLA, EOMI, discs not visualized.  Oral cavity is clear. No oral mucosal lesions are identified. Neck is clear without evidence of cervical or supraclavicular adenopathy. Lungs are clear to A&P. Cardiac examination is essentially unremarkable with regular rate and rhythm without murmur rub or thrill. Abdomen is benign with no organomegaly or masses noted. Motor sensory and DTR levels are equal and symmetric in the upper and lower extremities. Cranial nerves II through XII are grossly intact. Proprioception is intact. No peripheral adenopathy or edema is identified. No motor or sensory levels are noted. Crude visual fields are within normal range.  RADIOLOGY RESULTS: CT scans and PET/CT scans are all reviewed and compatible with the above-stated findings. MRI scan of the brain shows no evidence to suggest metastatic disease.  PLAN: At this time I to go ahead with concurrent chemoradiation therapy for stage III non-small cell lung cancer. I would treat a split course fashion. Initially treated to 4000 cGy over 4 weeks followed by [place] short treatment break to evaluate for response and possible small field boost. I first set up and ordered for early next week CT simulation.There will be extra effort by both professional staff as well as technical staff to coordinate and manage concurrent chemoradiation and ensuing side effects during his treatments. Risks and benefits of treatment including possible dysphasia increased cough skin reaction fatigue alteration of blood counts all were described in detail to the patient. He seems to comprehend my treatment plan well. We'll coordinate his chemotherapy with medical oncology.  I would like to  take this opportunity to thank you for allowing me to participate in the care of your patient.Armstead Peaks., MD

## 2017-07-02 ENCOUNTER — Ambulatory Visit
Admission: RE | Admit: 2017-07-02 | Discharge: 2017-07-02 | Disposition: A | Discharge status: Home / Self Care | Payer: Medicare Other | Source: Ambulatory Visit | Attending: Surgery | Admitting: Surgery

## 2017-07-02 ENCOUNTER — Encounter: Payer: Self-pay | Admitting: *Deleted

## 2017-07-02 ENCOUNTER — Ambulatory Visit: Payer: Medicare Other | Admitting: Anesthesiology

## 2017-07-02 ENCOUNTER — Encounter
Admission: RE | Disposition: A | Discharge status: Home / Self Care | Payer: Self-pay | Source: Ambulatory Visit | Attending: Surgery

## 2017-07-02 ENCOUNTER — Ambulatory Visit: Payer: Medicare Other

## 2017-07-02 DIAGNOSIS — I2510 Atherosclerotic heart disease of native coronary artery without angina pectoris: Secondary | ICD-10-CM | POA: Insufficient documentation

## 2017-07-02 DIAGNOSIS — Z79899 Other long term (current) drug therapy: Secondary | ICD-10-CM | POA: Insufficient documentation

## 2017-07-02 DIAGNOSIS — E785 Hyperlipidemia, unspecified: Secondary | ICD-10-CM | POA: Insufficient documentation

## 2017-07-02 DIAGNOSIS — C61 Malignant neoplasm of prostate: Secondary | ICD-10-CM | POA: Diagnosis not present

## 2017-07-02 DIAGNOSIS — C3432 Malignant neoplasm of lower lobe, left bronchus or lung: Secondary | ICD-10-CM

## 2017-07-02 DIAGNOSIS — C3490 Malignant neoplasm of unspecified part of unspecified bronchus or lung: Secondary | ICD-10-CM | POA: Diagnosis present

## 2017-07-02 DIAGNOSIS — C7951 Secondary malignant neoplasm of bone: Secondary | ICD-10-CM | POA: Insufficient documentation

## 2017-07-02 DIAGNOSIS — Z955 Presence of coronary angioplasty implant and graft: Secondary | ICD-10-CM | POA: Insufficient documentation

## 2017-07-02 HISTORY — PX: PORTACATH PLACEMENT: SHX2246

## 2017-07-02 SURGERY — INSERTION, TUNNELED CENTRAL VENOUS DEVICE, WITH PORT
Anesthesia: General | Laterality: Right | Wound class: Clean

## 2017-07-02 MED ORDER — PROPOFOL 10 MG/ML IV BOLUS
INTRAVENOUS | Status: AC
  Filled 2017-07-02: qty 40

## 2017-07-02 MED ORDER — LIDOCAINE HCL (PF) 1 % IJ SOLN
INTRAMUSCULAR | Status: DC | PRN
  Administered 2017-07-02: 7 mL

## 2017-07-02 MED ORDER — FAMOTIDINE 20 MG PO TABS
20.0000 mg | ORAL_TABLET | Freq: Once | ORAL | Status: AC
  Administered 2017-07-02: 20 mg via ORAL

## 2017-07-02 MED ORDER — LIDOCAINE HCL (CARDIAC) 20 MG/ML IV SOLN
INTRAVENOUS | Status: DC | PRN
  Administered 2017-07-02: 60 mg via INTRAVENOUS

## 2017-07-02 MED ORDER — LACTATED RINGERS IV SOLN
INTRAVENOUS | Status: DC
  Administered 2017-07-02: 14:00:00 via INTRAVENOUS

## 2017-07-02 MED ORDER — OXYCODONE HCL 5 MG/5ML PO SOLN: 5.0000 mg | Freq: Once | ORAL | Status: DC | PRN

## 2017-07-02 MED ORDER — FAMOTIDINE 20 MG PO TABS
ORAL_TABLET | ORAL | Status: AC
  Administered 2017-07-02: 20 mg via ORAL
  Filled 2017-07-02: qty 1

## 2017-07-02 MED ORDER — PROPOFOL 500 MG/50ML IV EMUL
INTRAVENOUS | Status: AC
  Filled 2017-07-02: qty 50

## 2017-07-02 MED ORDER — PROPOFOL 500 MG/50ML IV EMUL
INTRAVENOUS | Status: DC | PRN
  Administered 2017-07-02: 100 ug/kg/min via INTRAVENOUS

## 2017-07-02 MED ORDER — SODIUM CHLORIDE 0.9 % IJ SOLN
INTRAMUSCULAR | Status: AC
  Filled 2017-07-02: qty 50

## 2017-07-02 MED ORDER — LIDOCAINE HCL (PF) 1 % IJ SOLN
INTRAMUSCULAR | Status: AC
  Filled 2017-07-02: qty 30

## 2017-07-02 MED ORDER — PHENYLEPHRINE HCL 10 MG/ML IJ SOLN
INTRAMUSCULAR | Status: DC | PRN
  Administered 2017-07-02 (×3): 100 ug via INTRAVENOUS

## 2017-07-02 MED ORDER — HEPARIN SODIUM (PORCINE) 5000 UNIT/ML IJ SOLN
INTRAMUSCULAR | Status: AC
  Filled 2017-07-02: qty 1

## 2017-07-02 MED ORDER — FENTANYL CITRATE (PF) 100 MCG/2ML IJ SOLN
INTRAMUSCULAR | Status: DC | PRN
Start: 2017-07-02 — End: 2017-07-02
  Administered 2017-07-02: 25 ug via INTRAVENOUS

## 2017-07-02 MED ORDER — TRAMADOL HCL 50 MG PO TABS: 50.0000 mg | ORAL_TABLET | ORAL | Status: DC | PRN

## 2017-07-02 MED ORDER — CEFAZOLIN SODIUM-DEXTROSE 2-4 GM/100ML-% IV SOLN
2.0000 g | Freq: Once | INTRAVENOUS | Status: AC
  Administered 2017-07-02: 2 g via INTRAVENOUS

## 2017-07-02 MED ORDER — PROPOFOL 10 MG/ML IV BOLUS
INTRAVENOUS | Status: DC | PRN
  Administered 2017-07-02: 40 mg via INTRAVENOUS

## 2017-07-02 MED ORDER — CEFAZOLIN SODIUM-DEXTROSE 2-4 GM/100ML-% IV SOLN
INTRAVENOUS | Status: AC
  Filled 2017-07-02: qty 100

## 2017-07-02 MED ORDER — FENTANYL CITRATE (PF) 100 MCG/2ML IJ SOLN: 25.0000 ug | INTRAMUSCULAR | Status: DC | PRN

## 2017-07-02 MED ORDER — LIDOCAINE HCL (PF) 2 % IJ SOLN
INTRAMUSCULAR | Status: AC
  Filled 2017-07-02: qty 2

## 2017-07-02 MED ORDER — MEPERIDINE HCL 50 MG/ML IJ SOLN: 6.2500 mg | INTRAMUSCULAR | Status: DC | PRN

## 2017-07-02 MED ORDER — BUPIVACAINE-EPINEPHRINE 0.5% -1:200000 IJ SOLN
INTRAMUSCULAR | Status: DC | PRN
  Administered 2017-07-02: 7 mL

## 2017-07-02 MED ORDER — OXYCODONE HCL 5 MG PO TABS: 5.0000 mg | ORAL_TABLET | Freq: Once | ORAL | Status: DC | PRN

## 2017-07-02 MED ORDER — BUPIVACAINE HCL (PF) 0.5 % IJ SOLN
INTRAMUSCULAR | Status: AC
  Filled 2017-07-02: qty 30

## 2017-07-02 MED ORDER — SODIUM CHLORIDE 0.9 % IV SOLN
INTRAVENOUS | Status: DC | PRN
  Administered 2017-07-02: 5 mL via INTRAMUSCULAR

## 2017-07-02 MED ORDER — PROMETHAZINE HCL 25 MG/ML IJ SOLN: 6.2500 mg | INTRAMUSCULAR | Status: DC | PRN

## 2017-07-02 MED ORDER — FENTANYL CITRATE (PF) 100 MCG/2ML IJ SOLN
INTRAMUSCULAR | Status: AC
  Filled 2017-07-02: qty 2

## 2017-07-02 MED ORDER — MIDAZOLAM HCL 2 MG/2ML IJ SOLN
INTRAMUSCULAR | Status: AC
  Filled 2017-07-02: qty 2

## 2017-07-02 MED ORDER — BUPIVACAINE-EPINEPHRINE (PF) 0.5% -1:200000 IJ SOLN
INTRAMUSCULAR | Status: AC
  Filled 2017-07-02: qty 30

## 2017-07-02 SURGICAL SUPPLY — 25 items
CANISTER SUCT 1200ML W/VALVE (MISCELLANEOUS) ×3 IMPLANT
CHLORAPREP W/TINT 26ML (MISCELLANEOUS) ×3 IMPLANT
COVER LIGHT HANDLE STERIS (MISCELLANEOUS) ×6 IMPLANT
DERMABOND ADVANCED (GAUZE/BANDAGES/DRESSINGS) ×2
DERMABOND ADVANCED .7 DNX12 (GAUZE/BANDAGES/DRESSINGS) ×1 IMPLANT
DRAPE C-ARM XRAY 36X54 (DRAPES) ×3 IMPLANT
ELECT REM PT RETURN 9FT ADLT (ELECTROSURGICAL) ×3
ELECTRODE REM PT RTRN 9FT ADLT (ELECTROSURGICAL) ×1 IMPLANT
GLOVE BIO SURGEON STRL SZ7.5 (GLOVE) ×9 IMPLANT
GLOVE BIOGEL PI IND STRL 6.5 (GLOVE) ×2 IMPLANT
GLOVE BIOGEL PI INDICATOR 6.5 (GLOVE) ×4
GOWN STRL REUS W/ TWL LRG LVL3 (GOWN DISPOSABLE) ×3 IMPLANT
GOWN STRL REUS W/TWL LRG LVL3 (GOWN DISPOSABLE) ×6
KIT PORT POWER 8FR ISP CVUE (Miscellaneous) ×3 IMPLANT
KIT RM TURNOVER STRD PROC AR (KITS) ×3 IMPLANT
LABEL OR SOLS (LABEL) ×3 IMPLANT
NEEDLE FILTER BLUNT 18X 1/2SAF (NEEDLE) ×2
NEEDLE FILTER BLUNT 18X1 1/2 (NEEDLE) ×1 IMPLANT
PACK PORT-A-CATH (MISCELLANEOUS) ×3 IMPLANT
SUT MNCRL+ 5-0 UNDYED PC-3 (SUTURE) ×1 IMPLANT
SUT MONOCRYL 5-0 (SUTURE) ×2
SUT SILK 4 0 SH (SUTURE) ×3 IMPLANT
SUT VIC AB 5-0 RB1 27 (SUTURE) ×3 IMPLANT
SYR 3ML LL SCALE MARK (SYRINGE) ×3 IMPLANT
SYRINGE 10CC LL (SYRINGE) ×6 IMPLANT

## 2017-07-02 NOTE — Discharge Instructions (Addendum)
AMBULATORY SURGERY  DISCHARGE INSTRUCTIONS   1) The drugs that you were given will stay in your system until tomorrow so for the next 24 hours you should not:  A) Drive an automobile B) Make any legal decisions C) Drink any alcoholic beverage   2) You may resume regular meals tomorrow.  Today it is better to start with liquids and gradually work up to solid foods.  You may eat anything you prefer, but it is better to start with liquids, then soup and crackers, and gradually work up to solid foods.   3) Please notify your doctor immediately if you have any unusual bleeding, trouble breathing, redness and pain at the surgery site, drainage, fever, or pain not relieved by medication. 4)   5) Your post-operative visit with Dr.                                     is: Date:                        Time:    Please call to schedule your post-operative visit.  6) Additional Instructions:       Take Tylenol, ibuprofen, tramadol as needed for pain.  Should not drive or do anything dangerous when taking tramadol.  May shower and blot dry.  The glue will likely stick for 1 or 2 weeks and eventually peel off.

## 2017-07-02 NOTE — Transfer of Care (Signed)
Immediate Anesthesia Transfer of Care Note  Patient: George Greene.  Procedure(s) Performed: Procedure(s): INSERTION PORT-A-CATH (Right)  Patient Location: PACU  Anesthesia Type:General  Level of Consciousness: sedated  Airway & Oxygen Therapy: Patient Spontanous Breathing and Patient connected to face mask oxygen  Post-op Assessment: Report given to RN and Post -op Vital signs reviewed and stable  Post vital signs: Reviewed and stable  Last Vitals:  Vitals:   07/02/17 1354 07/02/17 1702  BP: 131/68 113/64  Pulse: (!) 55 61  Resp: 18 16  Temp: (!) 36.3 C (!) (P) 36.1 C  SpO2: 95% 100%    Last Pain:  Vitals:   07/02/17 1354  TempSrc: Tympanic  PainSc: 4          Complications: No apparent anesthesia complications

## 2017-07-02 NOTE — Anesthesia Postprocedure Evaluation (Signed)
Anesthesia Post Note  Patient: George Greene.  Procedure(s) Performed: Procedure(s) (LRB): INSERTION PORT-A-CATH (Right)  Patient location during evaluation: PACU Anesthesia Type: General Level of consciousness: awake and alert Pain management: pain level controlled Vital Signs Assessment: post-procedure vital signs reviewed and stable Respiratory status: spontaneous breathing, nonlabored ventilation, respiratory function stable and patient connected to nasal cannula oxygen Cardiovascular status: blood pressure returned to baseline and stable Postop Assessment: no signs of nausea or vomiting Anesthetic complications: no     Last Vitals:  Vitals:   07/02/17 1755 07/02/17 1812  BP: (!) 148/65 132/76  Pulse: (!) 57   Resp: 16   Temp: (!) 36.2 C   SpO2: 93%     Last Pain:  Vitals:   07/02/17 1755  TempSrc: Temporal  PainSc: 0-No pain                 Molli Barrows

## 2017-07-02 NOTE — Anesthesia Post-op Follow-up Note (Signed)
Anesthesia QCDR form completed.        

## 2017-07-02 NOTE — Anesthesia Preprocedure Evaluation (Addendum)
Anesthesia Evaluation  Patient identified by MRN, date of birth, ID band Patient awake    Reviewed: Allergy & Precautions, NPO status , Patient's Chart, lab work & pertinent test results  History of Anesthesia Complications Negative for: history of anesthetic complications  Airway Mallampati: III  TM Distance: >3 FB Neck ROM: Full    Dental no notable dental hx.    Pulmonary neg sleep apnea, neg COPD,  Lung cancer    breath sounds clear to auscultation- rhonchi (-) wheezing      Cardiovascular + CAD (medically managed) and + Past MI  (-) Cardiac Stents and (-) CABG  Rhythm:Regular Rate:Normal - Systolic murmurs and - Diastolic murmurs    Neuro/Psych negative neurological ROS  negative psych ROS   GI/Hepatic negative GI ROS, Neg liver ROS,   Endo/Other  negative endocrine ROSneg diabetes  Renal/GU negative Renal ROS     Musculoskeletal negative musculoskeletal ROS (+)   Abdominal (+) - obese,   Peds  Hematology negative hematology ROS (+)   Anesthesia Other Findings Past Medical History: No date: History of diverticulosis No date: Hyperlipidemia No date: Lung cancer ([facility]) No date: Myocardial infarction ([place]) No date: Prostate cancer ([place]) No date: Skin cancer of anterior chest     Comment:  Left side   Reproductive/Obstetrics                             Anesthesia Physical Anesthesia Plan  ASA: III  Anesthesia Plan: General   Post-op Pain Management:    Induction: Intravenous  PONV Risk Score and Plan: 1 and Ondansetron and Dexamethasone  Airway Management Planned: Natural Airway  Additional Equipment:   Intra-op Plan:   Post-operative Plan:   Informed Consent: I have reviewed the patients History and Physical, chart, labs and discussed the procedure including the risks, benefits and alternatives for the proposed anesthesia with the patient or authorized  representative who has indicated his/her understanding and acceptance.   Dental advisory given  Plan Discussed with: CRNA and Anesthesiologist  Anesthesia Plan Comments:        Anesthesia Quick Evaluation

## 2017-07-02 NOTE — Pre-Procedure Instructions (Signed)
He comes in today for insertion of Port-A-Cath. He reports no change in overall condition since the office visit.  The right side was marked YES  I discussed the plan for insertion of Port-A-Cath

## 2017-07-02 NOTE — Op Note (Signed)
.  OPERATIVE REPORT  PREOPERATIVE  DIAGNOSIS: .Marland Kitchen Lung cancer  POSTOPERATIVE DIAGNOSIS: . Lung cancer  PROCEDURE: .Marland Kitchen Insertion of central venous catheter with subcutaneous infusion port  ANESTHESIA:  General  SURGEON: Rochel Brome  MD   INDICATIONS: .Marland Kitchen He had recent findings of cancer of the left lower lobe of the lung. The catheter was recommended for central venous access for chemotherapy.  With the patient on the operating table in the supine position a rolled sheet was placed behind the shoulder blades to extend the neck. She was monitored by the anesthesia staff and sedated. The neck and chest wall were prepared with ChloraPrep and draped in a sterile manner.  The skin beneath the right clavicle was infiltrated with 1% Xylocaine. A transversely oriented 3 cm incision was made and carried down through subcutaneous tissues. Several small bleeding points were cauterized. A subcutaneous pouch was created large enough to admit the ClearView port. The jugular vein was identified with ultrasound. The skin overlying the jugular vein was infiltrated with 1% Xylocaine. A transversely oriented 5 mm incision was made and carried down through subcutaneous tissues. A needle was inserted into the jugular vein using ultrasound guidance. An image was saved for the paper chart. A guidewire was advanced down through the needle into the central circulation. Fluoroscopy was used to demonstrate the location of the guidewire in the vena cava. The dilator and introducer sheath were advanced over the guidewire. The guidewire and dilator were removed. The catheter was placed down through the sheath and the sheath was peeled away. Fluoroscopy was used to demonstrate the tip of the catheter in the superior vena cava. An image was saved for the paper chart. The catheter was tunneled to the subclavian incision and pressure held over the tunnel site. The catheter was cut to fit and attached to the ClearView port and accessed  with a Huber needle aspirating a trace of blood and flushing with 5 cc of heparinized saline solution. The port was placed into the subcutaneous pouch and sutured to the deep fascia with 4-0 silk. Hemostasis was intact. Subcutaneous tissues were approximated with 5-0 Vicryl. Both skin incisions were closed with 5-0 Monocryl subcuticular suture and Dermabond. The patient tolerated surgery satisfactorily and was prepared for transfer to the recovery room.  Rochel Brome M.D.

## 2017-07-03 ENCOUNTER — Telehealth: Payer: Self-pay | Admitting: Internal Medicine

## 2017-07-03 ENCOUNTER — Encounter: Payer: Self-pay | Admitting: Surgery

## 2017-07-03 NOTE — Telephone Encounter (Signed)
George Greene, inform patient that I spoke to Dr. Donella Stade- is recommending chemoradiation as if this is stage III lung cnacer. Please check with radiation- when the patient is going to radiation. We can plan starting weekly carbotaxol in the same week.   # Heather schedule- weekly carbotaxol; weekly CBC BMP; I will see with his third treatment/labs CBC CMP. Thx

## 2017-07-04 ENCOUNTER — Other Ambulatory Visit: Payer: Self-pay | Admitting: *Deleted

## 2017-07-04 DIAGNOSIS — C3432 Malignant neoplasm of lower lobe, left bronchus or lung: Secondary | ICD-10-CM

## 2017-07-04 NOTE — Telephone Encounter (Signed)
Pt scheduled for CT simulation on 9/4. Radiation to be scheduled at that time. Will continue to follow.

## 2017-07-04 NOTE — Telephone Encounter (Signed)
msg sent to sch. Team to sch. Pt for chemotherapy treatments. Lab orders entered per md order

## 2017-07-09 ENCOUNTER — Ambulatory Visit
Admission: RE | Admit: 2017-07-09 | Discharge: 2017-07-09 | Disposition: A | Discharge status: Home / Self Care | Payer: Medicare Other | Source: Ambulatory Visit | Attending: Radiation Oncology | Admitting: Radiation Oncology

## 2017-07-09 DIAGNOSIS — C3432 Malignant neoplasm of lower lobe, left bronchus or lung: Secondary | ICD-10-CM | POA: Diagnosis not present

## 2017-07-12 ENCOUNTER — Inpatient Hospital Stay: Payer: Medicare Other

## 2017-07-15 ENCOUNTER — Other Ambulatory Visit: Payer: Self-pay | Admitting: *Deleted

## 2017-07-15 DIAGNOSIS — C3432 Malignant neoplasm of lower lobe, left bronchus or lung: Secondary | ICD-10-CM | POA: Diagnosis not present

## 2017-07-16 ENCOUNTER — Ambulatory Visit
Admission: RE | Admit: 2017-07-16 | Discharge: 2017-07-16 | Disposition: A | Discharge status: Home / Self Care | Payer: Medicare Other | Source: Ambulatory Visit | Attending: Radiation Oncology | Admitting: Radiation Oncology

## 2017-07-16 DIAGNOSIS — C3432 Malignant neoplasm of lower lobe, left bronchus or lung: Secondary | ICD-10-CM | POA: Diagnosis not present

## 2017-07-17 ENCOUNTER — Inpatient Hospital Stay: Payer: Medicare Other | Attending: Internal Medicine

## 2017-07-17 ENCOUNTER — Inpatient Hospital Stay: Payer: Medicare Other

## 2017-07-17 ENCOUNTER — Encounter: Payer: Self-pay | Admitting: *Deleted

## 2017-07-17 ENCOUNTER — Ambulatory Visit
Admission: RE | Admit: 2017-07-17 | Discharge: 2017-07-17 | Disposition: A | Discharge status: Home / Self Care | Payer: Medicare Other | Source: Ambulatory Visit | Attending: Radiation Oncology | Admitting: Radiation Oncology

## 2017-07-17 ENCOUNTER — Other Ambulatory Visit: Payer: Self-pay | Admitting: Internal Medicine

## 2017-07-17 VITALS — BP 117/68 | HR 56 | Temp 98.1°F | Resp 18 | Wt 180.8 lb

## 2017-07-17 DIAGNOSIS — E785 Hyperlipidemia, unspecified: Secondary | ICD-10-CM | POA: Insufficient documentation

## 2017-07-17 DIAGNOSIS — C61 Malignant neoplasm of prostate: Secondary | ICD-10-CM | POA: Diagnosis not present

## 2017-07-17 DIAGNOSIS — Z79899 Other long term (current) drug therapy: Secondary | ICD-10-CM | POA: Insufficient documentation

## 2017-07-17 DIAGNOSIS — I252 Old myocardial infarction: Secondary | ICD-10-CM | POA: Diagnosis not present

## 2017-07-17 DIAGNOSIS — K5730 Diverticulosis of large intestine without perforation or abscess without bleeding: Secondary | ICD-10-CM | POA: Insufficient documentation

## 2017-07-17 DIAGNOSIS — J8410 Pulmonary fibrosis, unspecified: Secondary | ICD-10-CM | POA: Insufficient documentation

## 2017-07-17 DIAGNOSIS — E041 Nontoxic single thyroid nodule: Secondary | ICD-10-CM | POA: Diagnosis not present

## 2017-07-17 DIAGNOSIS — Z7982 Long term (current) use of aspirin: Secondary | ICD-10-CM | POA: Diagnosis not present

## 2017-07-17 DIAGNOSIS — C7951 Secondary malignant neoplasm of bone: Secondary | ICD-10-CM | POA: Diagnosis not present

## 2017-07-17 DIAGNOSIS — Z9079 Acquired absence of other genital organ(s): Secondary | ICD-10-CM | POA: Diagnosis not present

## 2017-07-17 DIAGNOSIS — I700 Atherosclerosis of aorta: Secondary | ICD-10-CM | POA: Insufficient documentation

## 2017-07-17 DIAGNOSIS — C3432 Malignant neoplasm of lower lobe, left bronchus or lung: Secondary | ICD-10-CM | POA: Insufficient documentation

## 2017-07-17 LAB — CBC WITH DIFFERENTIAL/PLATELET
Basophils Absolute: 0 10*3/uL (ref 0–0.1)
Basophils Relative: 0 %
EOS ABS: 0 10*3/uL (ref 0–0.7)
EOS PCT: 0 %
HCT: 38.2 % — ABNORMAL LOW (ref 40.0–52.0)
HEMOGLOBIN: 13.2 g/dL (ref 13.0–18.0)
LYMPHS ABS: 0.8 10*3/uL — AB (ref 1.0–3.6)
Lymphocytes Relative: 8 %
MCH: 31.2 pg (ref 26.0–34.0)
MCHC: 34.6 g/dL (ref 32.0–36.0)
MCV: 90.3 fL (ref 80.0–100.0)
Monocytes Absolute: 1.1 10*3/uL — ABNORMAL HIGH (ref 0.2–1.0)
Monocytes Relative: 10 %
NEUTROS PCT: 82 %
Neutro Abs: 9 10*3/uL — ABNORMAL HIGH (ref 1.4–6.5)
Platelets: 269 10*3/uL (ref 150–440)
RBC: 4.22 MIL/uL — AB (ref 4.40–5.90)
RDW: 14.7 % — ABNORMAL HIGH (ref 11.5–14.5)
WBC: 11 10*3/uL — AB (ref 3.8–10.6)

## 2017-07-17 LAB — BASIC METABOLIC PANEL
ANION GAP: 7 (ref 5–15)
BUN: 25 mg/dL — ABNORMAL HIGH (ref 6–20)
CHLORIDE: 102 mmol/L (ref 101–111)
CO2: 28 mmol/L (ref 22–32)
Calcium: 9.4 mg/dL (ref 8.9–10.3)
Creatinine, Ser: 1.04 mg/dL (ref 0.61–1.24)
Glucose, Bld: 123 mg/dL — ABNORMAL HIGH (ref 65–99)
POTASSIUM: 4.2 mmol/L (ref 3.5–5.1)
SODIUM: 137 mmol/L (ref 135–145)

## 2017-07-17 MED ORDER — FAMOTIDINE IN NACL 20-0.9 MG/50ML-% IV SOLN
20.0000 mg | Freq: Once | INTRAVENOUS | Status: AC
  Administered 2017-07-17: 20 mg via INTRAVENOUS
  Filled 2017-07-17: qty 50

## 2017-07-17 MED ORDER — PALONOSETRON HCL INJECTION 0.25 MG/5ML
0.2500 mg | Freq: Once | INTRAVENOUS | Status: AC
  Administered 2017-07-17: 0.25 mg via INTRAVENOUS
  Filled 2017-07-17: qty 5

## 2017-07-17 MED ORDER — PACLITAXEL CHEMO INJECTION 300 MG/50ML
45.0000 mg/m2 | Freq: Once | INTRAVENOUS | Status: AC
  Administered 2017-07-17: 90 mg via INTRAVENOUS
  Filled 2017-07-17: qty 15

## 2017-07-17 MED ORDER — SODIUM CHLORIDE 0.9 % IV SOLN
182.2000 mg | Freq: Once | INTRAVENOUS | Status: AC
  Administered 2017-07-17: 180 mg via INTRAVENOUS
  Filled 2017-07-17: qty 18

## 2017-07-17 MED ORDER — SODIUM CHLORIDE 0.9 % IV SOLN
20.0000 mg | Freq: Once | INTRAVENOUS | Status: AC
  Administered 2017-07-17: 20 mg via INTRAVENOUS
  Filled 2017-07-17: qty 2

## 2017-07-17 MED ORDER — DIPHENHYDRAMINE HCL 50 MG/ML IJ SOLN
50.0000 mg | Freq: Once | INTRAMUSCULAR | Status: AC
  Administered 2017-07-17: 50 mg via INTRAVENOUS
  Filled 2017-07-17: qty 1

## 2017-07-17 MED ORDER — SODIUM CHLORIDE 0.9 % IV SOLN
Freq: Once | INTRAVENOUS | Status: AC
  Administered 2017-07-17: 11:00:00 via INTRAVENOUS
  Filled 2017-07-17: qty 1000

## 2017-07-17 MED ORDER — HEPARIN SOD (PORK) LOCK FLUSH 100 UNIT/ML IV SOLN
500.0000 [IU] | Freq: Once | INTRAVENOUS | Status: AC | PRN
Start: 2017-07-17 — End: 2017-07-17
  Administered 2017-07-17: 500 [IU]
  Filled 2017-07-17: qty 5

## 2017-07-17 NOTE — Progress Notes (Signed)
  Oncology Nurse Navigator Documentation  Navigator Location: [facility] (07/17/17 1000)   )Navigator Encounter Type: Treatment (07/17/17 1000)                   Treatment Initiated Date: 07/17/17 (07/17/17 1000)   Treatment Phase: First Chemo Tx (07/17/17 1000) Barriers/Navigation Needs: No barriers at this time (07/17/17 1000)   Interventions: Coordination of Care (07/17/17 1000)   Coordination of Care: Appts (massage therapy) (07/17/17 1000)     met with patient prior to receiving first chemo. Assisted pt in getting scheduled for massage. No further questions or needs. Informed pt to call if needs anything else. Pt verbalized understanding.              Time Spent with Patient: 15 (07/17/17 1000)

## 2017-07-18 ENCOUNTER — Encounter: Payer: Self-pay | Admitting: Internal Medicine

## 2017-07-18 ENCOUNTER — Ambulatory Visit
Admission: RE | Admit: 2017-07-18 | Discharge: 2017-07-18 | Disposition: A | Discharge status: Home / Self Care | Payer: Medicare Other | Source: Ambulatory Visit | Attending: Radiation Oncology | Admitting: Radiation Oncology

## 2017-07-18 ENCOUNTER — Ambulatory Visit: Payer: Medicare Other

## 2017-07-18 DIAGNOSIS — C3432 Malignant neoplasm of lower lobe, left bronchus or lung: Secondary | ICD-10-CM | POA: Diagnosis not present

## 2017-07-19 ENCOUNTER — Ambulatory Visit
Admission: RE | Admit: 2017-07-19 | Discharge: 2017-07-19 | Disposition: A | Discharge status: Home / Self Care | Payer: Medicare Other | Source: Ambulatory Visit | Attending: Radiation Oncology | Admitting: Radiation Oncology

## 2017-07-19 ENCOUNTER — Ambulatory Visit: Payer: Medicare Other

## 2017-07-19 ENCOUNTER — Other Ambulatory Visit: Payer: Medicare Other

## 2017-07-19 DIAGNOSIS — C3432 Malignant neoplasm of lower lobe, left bronchus or lung: Secondary | ICD-10-CM | POA: Diagnosis not present

## 2017-07-22 ENCOUNTER — Telehealth: Payer: Self-pay | Admitting: *Deleted

## 2017-07-22 ENCOUNTER — Ambulatory Visit
Admission: RE | Admit: 2017-07-22 | Discharge: 2017-07-22 | Disposition: A | Discharge status: Home / Self Care | Payer: Medicare Other | Source: Ambulatory Visit | Attending: Radiation Oncology | Admitting: Radiation Oncology

## 2017-07-22 ENCOUNTER — Telehealth: Payer: Self-pay | Admitting: Internal Medicine

## 2017-07-22 DIAGNOSIS — C3432 Malignant neoplasm of lower lobe, left bronchus or lung: Secondary | ICD-10-CM | POA: Diagnosis not present

## 2017-07-22 DIAGNOSIS — Z5189 Encounter for other specified aftercare: Secondary | ICD-10-CM

## 2017-07-22 DIAGNOSIS — R1115 Cyclical vomiting syndrome unrelated to migraine: Secondary | ICD-10-CM

## 2017-07-23 ENCOUNTER — Inpatient Hospital Stay: Payer: Medicare Other

## 2017-07-23 ENCOUNTER — Ambulatory Visit: Payer: Medicare Other

## 2017-07-23 ENCOUNTER — Other Ambulatory Visit: Payer: Medicare Other

## 2017-07-23 ENCOUNTER — Telehealth: Payer: Self-pay | Admitting: *Deleted

## 2017-07-23 NOTE — Telephone Encounter (Signed)
Dr. Rogue Bussing made aware.

## 2017-07-23 NOTE — Telephone Encounter (Signed)
-----   Message from [place] sent at 07/23/2017  8:43 AM EDT ----- Regarding: deceased Pt deceased last night at home

## 2017-07-24 ENCOUNTER — Ambulatory Visit: Payer: Medicare Other

## 2017-07-24 ENCOUNTER — Inpatient Hospital Stay: Payer: Medicare Other

## 2017-07-25 ENCOUNTER — Ambulatory Visit: Payer: Medicare Other

## 2017-07-25 ENCOUNTER — Telehealth: Payer: Self-pay | Admitting: Internal Medicine

## 2017-07-25 NOTE — Telephone Encounter (Signed)
Spoke to pt's wife re: pts demise; offered condolences. Thankful of the care given at [facility].

## 2017-07-25 NOTE — Telephone Encounter (Signed)
-----   Message from [place] sent at 07/23/2017  8:43 AM EDT ----- Regarding: deceased Pt deceased last night at home

## 2017-07-26 ENCOUNTER — Ambulatory Visit: Payer: Medicare Other

## 2017-07-26 ENCOUNTER — Ambulatory Visit: Payer: Medicare Other | Admitting: Internal Medicine

## 2017-07-26 ENCOUNTER — Other Ambulatory Visit: Payer: Medicare Other

## 2017-07-29 ENCOUNTER — Ambulatory Visit: Payer: Medicare Other

## 2017-07-30 ENCOUNTER — Ambulatory Visit: Payer: Medicare Other

## 2017-07-31 ENCOUNTER — Ambulatory Visit: Payer: Medicare Other

## 2017-07-31 ENCOUNTER — Other Ambulatory Visit: Payer: Medicare Other

## 2017-07-31 ENCOUNTER — Ambulatory Visit: Payer: Medicare Other | Admitting: Internal Medicine

## 2017-08-01 ENCOUNTER — Ambulatory Visit: Payer: Medicare Other

## 2017-08-02 ENCOUNTER — Ambulatory Visit: Payer: Medicare Other

## 2017-08-05 ENCOUNTER — Ambulatory Visit: Payer: Medicare Other

## 2017-08-05 NOTE — Addendum Note (Signed)
Encounter addended by: Valinda Hoar on: 08/05/2017  3:45 PM<BR>    Actions taken: Imaging Exam ended

## 2017-08-05 NOTE — Telephone Encounter (Signed)
Wife called to report that patient is now unable to keep anything down. He has had bowel movement this morning. Discussed with Dr Rogue Bussing and he wants patient to come for Lab/ IV fluids tomorrow or if cannot wait, go to ER tonight. I spoke with wife and she agrees to appointment tomorrow at 130 for lab 145 for IV fluids   Hassan Rowan- If severely symptomatic recommend going to ER; however if can wait until tomorrow- then recommend checking labs- cbc/cmp/lipase/amylase/mag; possible IVFs.

## 2017-08-05 NOTE — Telephone Encounter (Signed)
George Greene- If severely symptomatic recommend going to ER; however if can wait until tomorrow- then recommend checking labs- cbc/cmp/lipase/amylase/mag; possible IVFs.

## 2017-08-05 DEATH — deceased

## 2017-08-06 ENCOUNTER — Ambulatory Visit: Payer: Medicare Other

## 2017-08-07 ENCOUNTER — Ambulatory Visit: Payer: Medicare Other

## 2017-08-08 ENCOUNTER — Ambulatory Visit: Payer: Medicare Other

## 2017-08-09 ENCOUNTER — Ambulatory Visit: Payer: Medicare Other

## 2017-08-12 ENCOUNTER — Ambulatory Visit: Payer: Medicare Other

## 2017-08-13 ENCOUNTER — Ambulatory Visit: Payer: Medicare Other

## 2017-08-14 ENCOUNTER — Ambulatory Visit: Payer: Medicare Other

## 2017-08-15 ENCOUNTER — Ambulatory Visit: Payer: Medicare Other

## 2017-08-16 ENCOUNTER — Ambulatory Visit: Payer: Medicare Other

## 2017-08-16 ENCOUNTER — Other Ambulatory Visit: Payer: Self-pay | Admitting: Nurse Practitioner

## 2017-10-11 ENCOUNTER — Encounter: Payer: Self-pay | Admitting: Internal Medicine

## 2018-02-07 IMAGING — MR MR HEAD WO/W CM
13 series · 48 of 48 positions shown · IV contrast (multihance)
Comparison: None.

CLINICAL DATA: Left lower lobe lung mass with bone metastases.
Evaluation for brain metastases.

EXAM:
MRI HEAD WITHOUT AND WITH CONTRAST
TECHNIQUE: Multiplanar, multiecho pulse sequences of the brain and surrounding
structures were obtained without and with intravenous contrast.
CONTRAST:  18mL MULTIHANCE GADOBENATE DIMEGLUMINE 529 MG/ML IV SOLN

[Series 2: T1 · sagittal · 5.0mm · 0.45mm/px · 2 of 28 slices shown (1 of 2)]
[im 1/28]
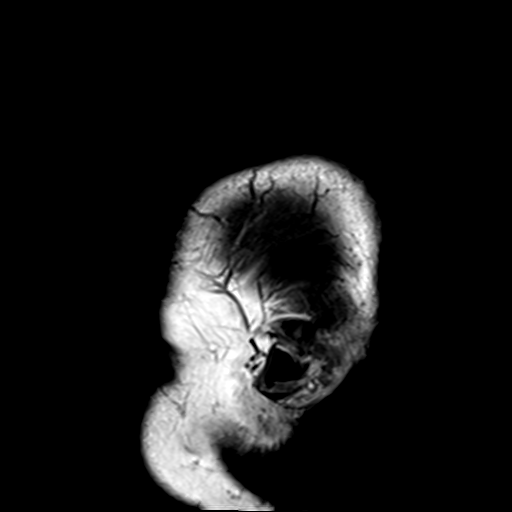
[im 28/28]
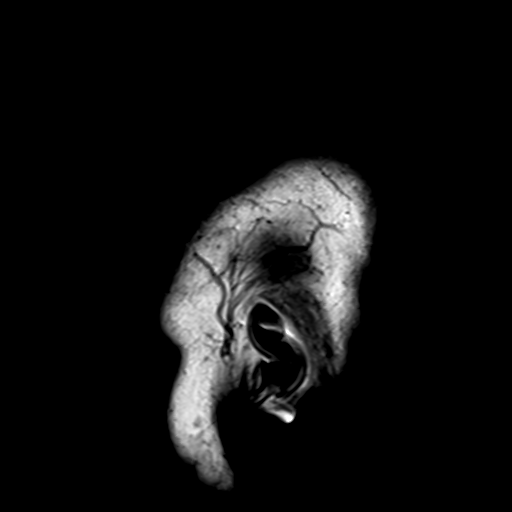

[Series 4: DWI · axial · 3.0mm · 1.80mm/px · z∈[-81,+87]mm · 4 of 57 slices shown (1 of 2)]
[im 1/57]
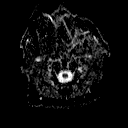
[im 19/57]
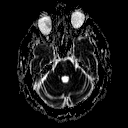
[im 38/57]
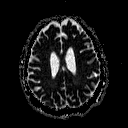
[im 57/57]
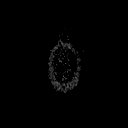

[Series 6: DWI · coronal · 3.0mm · 1.80mm/px · 3 of 47 slices shown (2 of 2)]
[im 1/47]
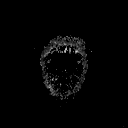
[im 24/47]
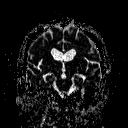
[im 47/47]
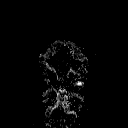

[Series 7: T2 · axial · 5.0mm · 0.60mm/px · z∈[-78,+78]mm · 2 of 25 slices shown (1 of 2)]
[im 1/25]
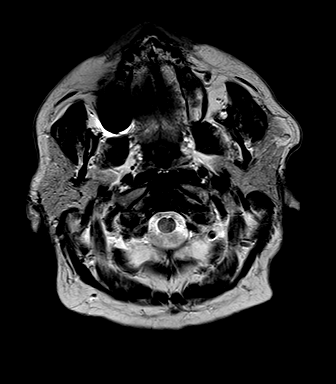
[im 25/25]
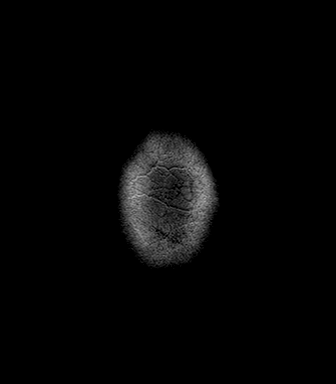

[Series 8: FLAIR · axial · 3.0mm · 0.45mm/px · z∈[-78,+78]mm · 3 of 53 slices shown]
[im 1/53]
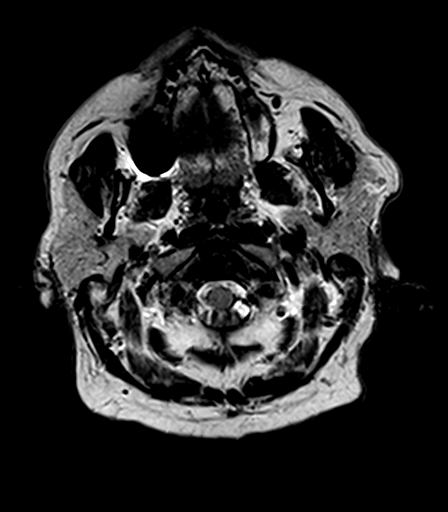
[im 27/53]
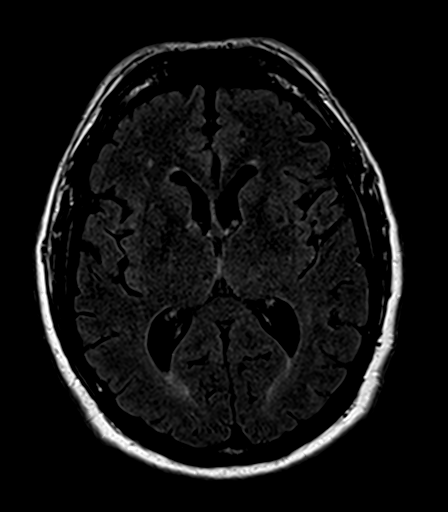
[im 53/53]
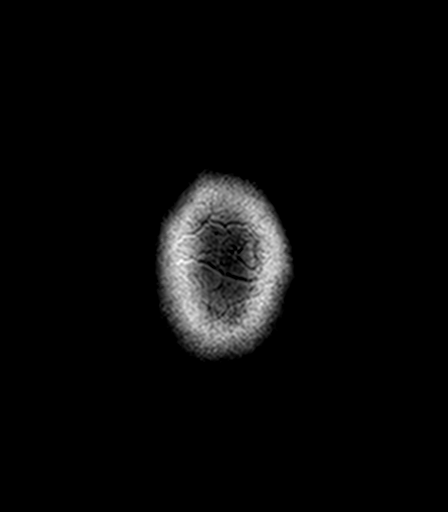

[Series 9: T2 · axial · 5.0mm · 0.45mm/px · z∈[-78,+78]mm · 2 of 25 slices shown (2 of 2)]
[im 1/25]
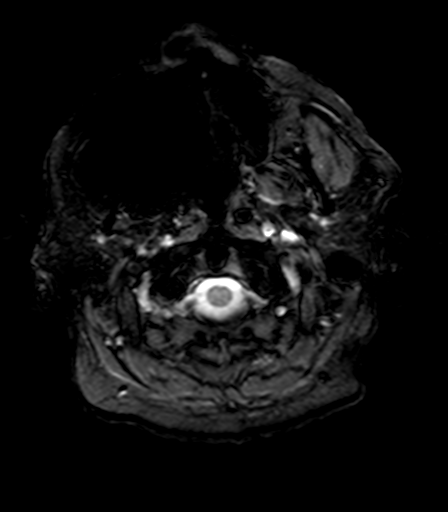
[im 25/25]
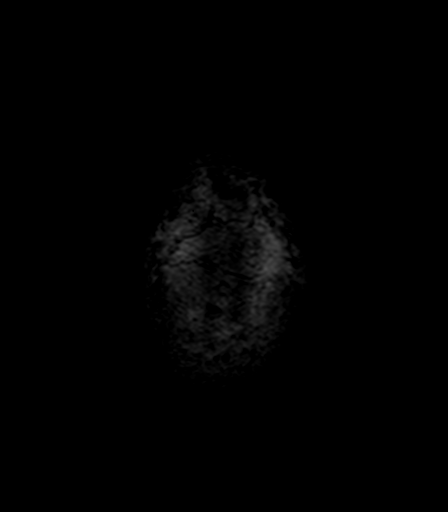

[Series 10: T1 · axial · 1.0mm · 1.00mm/px · z∈[-80,+79]mm · 10 of 160 slices shown (2 of 2)]
[im 1/160]
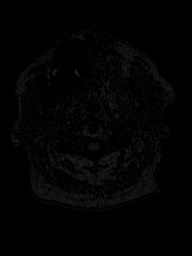
[im 18/160]
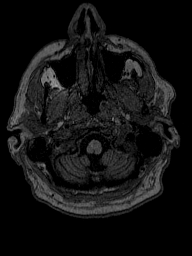
[im 36/160]
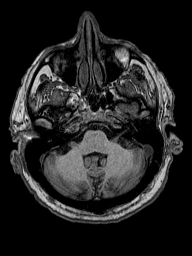
[im 54/160]
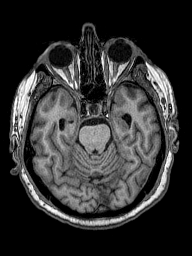
[im 71/160]
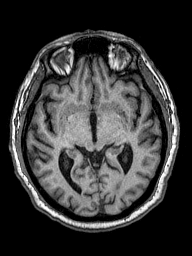
[im 89/160]
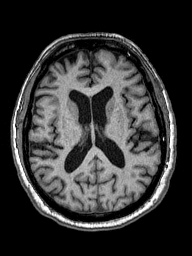
[im 107/160]
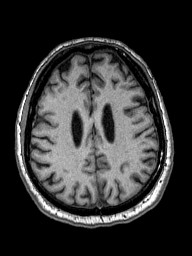
[im 124/160]
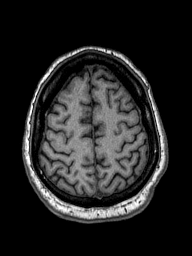
[im 142/160]
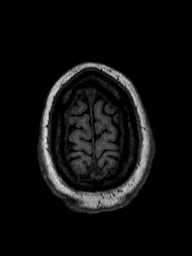
[im 160/160]
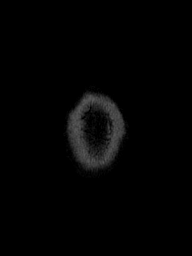

[Series 11: T2 post-contrast · coronal · 5.0mm · 0.49mm/px · 2 of 30 slices shown]
[im 1/30]
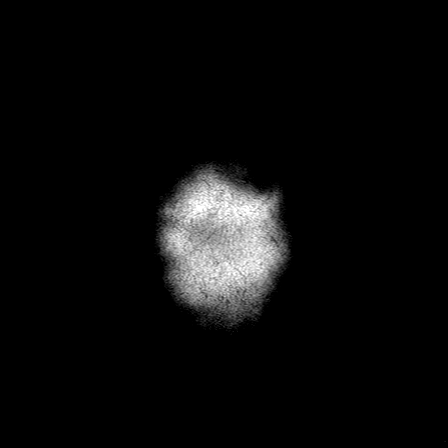
[im 30/30]
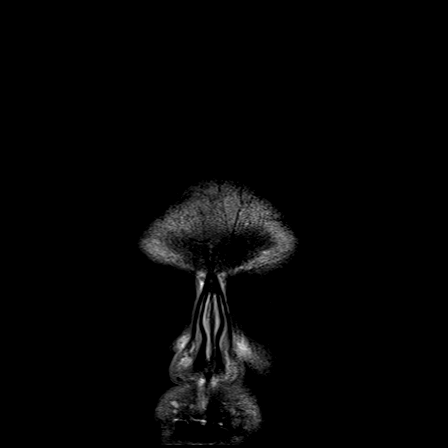

[Series 12: T1 post-contrast · axial · 1.0mm · 1.00mm/px · z∈[-80,+79]mm · 10 of 160 slices shown (1 of 3)]
[im 1/160]
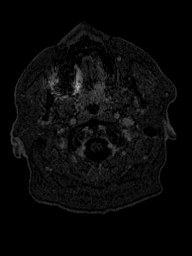
[im 18/160]
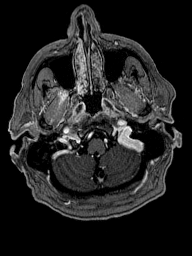
[im 36/160]
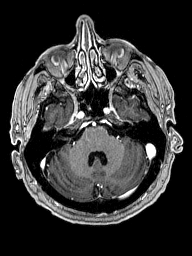
[im 54/160]
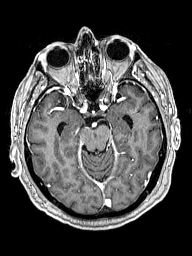
[im 71/160]
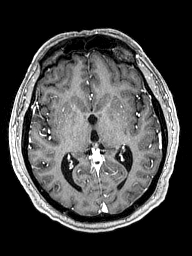
[im 89/160]
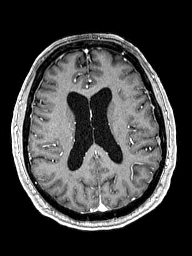
[im 107/160]
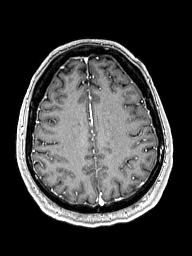
[im 124/160]
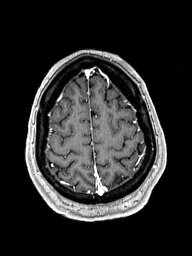
[im 142/160]
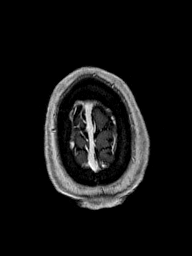
[im 160/160]
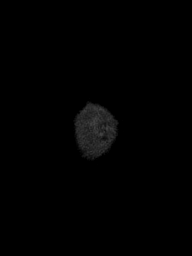

[Series 13: T1 post-contrast · coronal · 5.0mm · 0.43mm/px · 2 of 30 slices shown (2 of 3)]
[im 1/30]
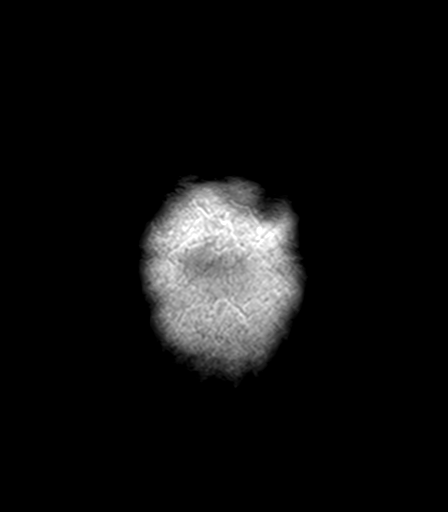
[im 30/30]
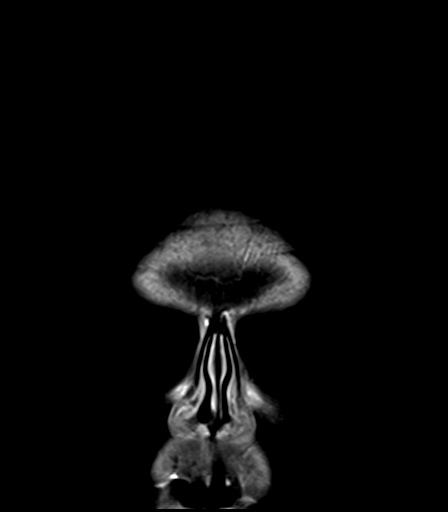

[Series 14: T1 post-contrast · sagittal · 5.0mm · 0.45mm/px · 2 of 28 slices shown (3 of 3)]
[im 1/28]
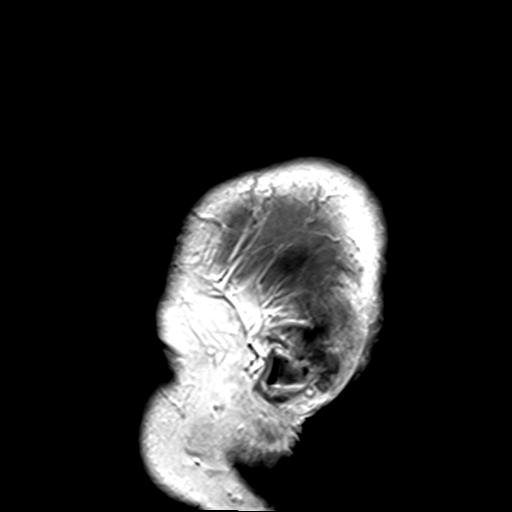
[im 28/28]
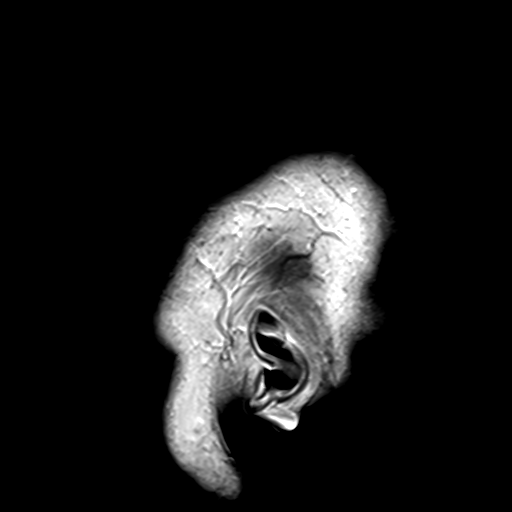

[Series 100: ax (id) · axial · 3.0mm · 1.80mm/px · z∈[-81,+87]mm · 3 of 54 slices shown]
[im 1/54]
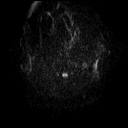
[im 27/54]
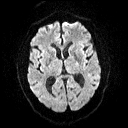
[im 54/54]
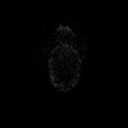

[Series 101: cor (id) · coronal · 3.0mm · 1.80mm/px · 3 of 47 slices shown]
[im 1/47]
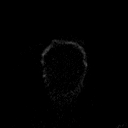
[im 24/47]
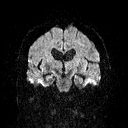
[im 47/47]
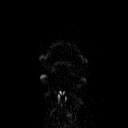

[48 of 48 positions shown; findings below may reference images not displayed]

FINDINGS: Brain: There is no evidence of acute infarct, intracranial
hemorrhage, mass, midline shift, or extra-axial fluid collection.
Generalized cerebral atrophy is mild for age. A few scattered
cerebral white matter T2 hyperintensities are within normal limits
for age. No abnormal enhancement is identified.

Vascular: Major intracranial vascular flow voids are preserved.

Skull and upper cervical spine: Unremarkable bone marrow signal.

Sinuses/Orbits: Bilateral cataract extraction. Trace right mastoid
fluid. Clear paranasal sinuses.

Other: None.
IMPRESSION: No evidence of intracranial metastases.

## 2018-03-18 IMAGING — DX DG CHEST 1V PORT
1 series · 1 of 1 positions shown · non-contrast
Comparison: 06/10/2017

CLINICAL DATA: Post Port-A-Cath placement

EXAM:
PORTABLE CHEST 1 VIEW

[chest ap]
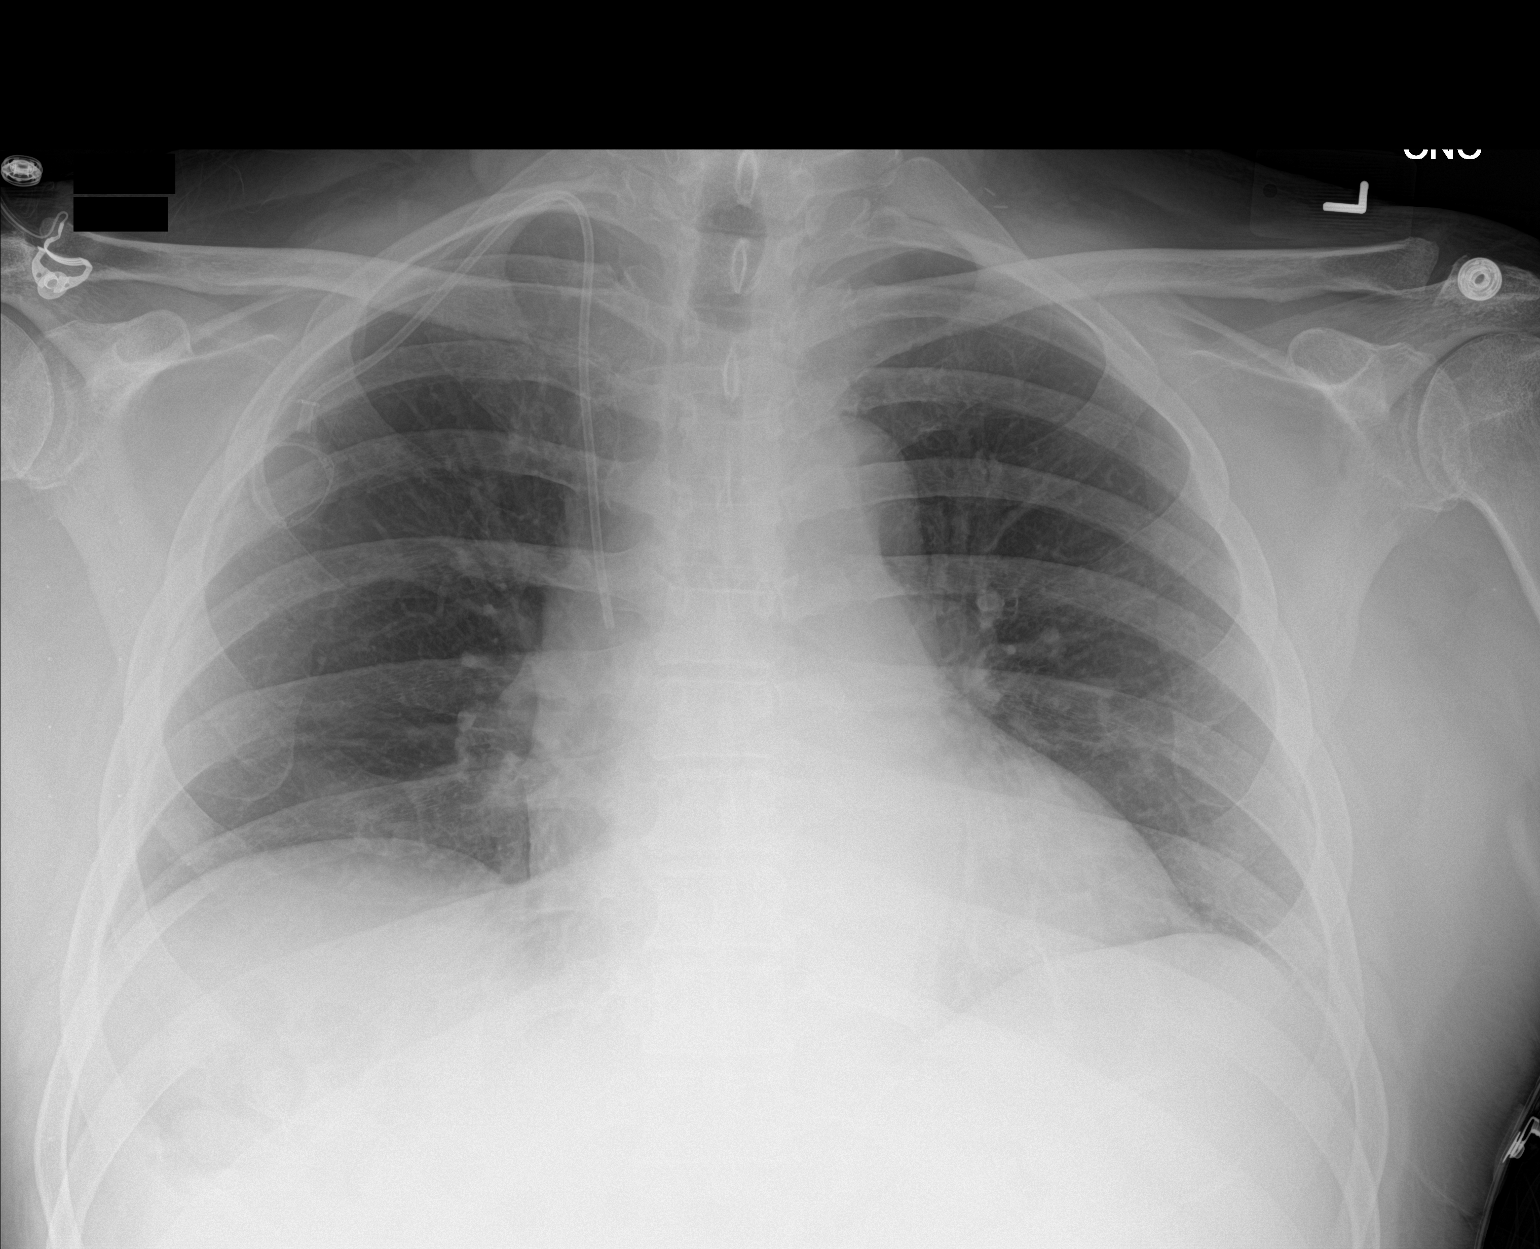

[1 of 1 positions shown; findings below may reference images not displayed]

FINDINGS: Right Port-A-Cath has been placed with the tip in the SVC. No
pneumothorax. Low lung volumes. No confluent opacities or effusions.
Heart is normal size.
IMPRESSION: Right Port-A-Cath tip in the SVC. No pneumothorax. No active
disease.
